# Patient Record
Sex: Male | Born: 1945
Health system: Southern US, Community
[De-identification: ages and names within clinical notes are randomized; demographics above are authoritative.]

## PROBLEM LIST (undated history)

## (undated) DIAGNOSIS — F419 Anxiety disorder, unspecified: Secondary | ICD-10-CM

## (undated) DIAGNOSIS — G8929 Other chronic pain: Secondary | ICD-10-CM

## (undated) DIAGNOSIS — J439 Emphysema, unspecified: Secondary | ICD-10-CM

## (undated) DIAGNOSIS — Z9289 Personal history of other medical treatment: Secondary | ICD-10-CM

## (undated) DIAGNOSIS — J189 Pneumonia, unspecified organism: Secondary | ICD-10-CM

## (undated) DIAGNOSIS — F329 Major depressive disorder, single episode, unspecified: Secondary | ICD-10-CM

## (undated) DIAGNOSIS — I4891 Unspecified atrial fibrillation: Principal | ICD-10-CM

## (undated) DIAGNOSIS — M1990 Unspecified osteoarthritis, unspecified site: Secondary | ICD-10-CM

## (undated) DIAGNOSIS — M549 Dorsalgia, unspecified: Secondary | ICD-10-CM

## (undated) HISTORY — DX: Personal history of other medical treatment: Z92.89

## (undated) HISTORY — DX: Anxiety disorder, unspecified: F41.9

## (undated) HISTORY — DX: Emphysema, unspecified: J43.9

---

## 1969-08-22 DIAGNOSIS — F32A Depression, unspecified: Secondary | ICD-10-CM

## 1969-08-22 DIAGNOSIS — J189 Pneumonia, unspecified organism: Secondary | ICD-10-CM

## 1969-08-22 HISTORY — DX: Pneumonia, unspecified organism: J18.9

## 1969-08-22 HISTORY — DX: Depression, unspecified: F32.A

## 2005-12-22 HISTORY — PX: INGUINAL HERNIA REPAIR: SUR1180

## 2006-08-25 ENCOUNTER — Encounter: Admission: RE | Admit: 2006-08-25 | Discharge: 2006-08-25 | Payer: Self-pay | Admitting: General Surgery

## 2006-09-02 ENCOUNTER — Ambulatory Visit (HOSPITAL_BASED_OUTPATIENT_CLINIC_OR_DEPARTMENT_OTHER): Admission: RE | Admit: 2006-09-02 | Discharge: 2006-09-02 | Payer: Self-pay | Admitting: General Surgery

## 2014-02-03 ENCOUNTER — Encounter (HOSPITAL_COMMUNITY): Payer: Self-pay | Admitting: Emergency Medicine

## 2014-02-03 ENCOUNTER — Inpatient Hospital Stay (HOSPITAL_COMMUNITY)
Admission: EM | Admit: 2014-02-03 | Discharge: 2014-02-06 | DRG: 309 | Disposition: A | Discharge status: Home / Self Care | Payer: Medicare Other | Attending: Internal Medicine | Admitting: Internal Medicine

## 2014-02-03 ENCOUNTER — Inpatient Hospital Stay (HOSPITAL_COMMUNITY): Payer: Medicare Other

## 2014-02-03 ENCOUNTER — Emergency Department (HOSPITAL_COMMUNITY): Payer: Medicare Other

## 2014-02-03 DIAGNOSIS — E872 Acidosis, unspecified: Secondary | ICD-10-CM | POA: Diagnosis present

## 2014-02-03 DIAGNOSIS — R0989 Other specified symptoms and signs involving the circulatory and respiratory systems: Secondary | ICD-10-CM | POA: Diagnosis present

## 2014-02-03 DIAGNOSIS — R000 Tachycardia, unspecified: Secondary | ICD-10-CM | POA: Diagnosis present

## 2014-02-03 DIAGNOSIS — R0609 Other forms of dyspnea: Secondary | ICD-10-CM | POA: Diagnosis present

## 2014-02-03 DIAGNOSIS — Z602 Problems related to living alone: Secondary | ICD-10-CM

## 2014-02-03 DIAGNOSIS — I4891 Unspecified atrial fibrillation: Principal | ICD-10-CM | POA: Diagnosis present

## 2014-02-03 DIAGNOSIS — E785 Hyperlipidemia, unspecified: Secondary | ICD-10-CM | POA: Diagnosis present

## 2014-02-03 DIAGNOSIS — J9819 Other pulmonary collapse: Secondary | ICD-10-CM | POA: Diagnosis present

## 2014-02-03 DIAGNOSIS — I10 Essential (primary) hypertension: Secondary | ICD-10-CM | POA: Diagnosis present

## 2014-02-03 DIAGNOSIS — M549 Dorsalgia, unspecified: Secondary | ICD-10-CM | POA: Diagnosis present

## 2014-02-03 DIAGNOSIS — R0600 Dyspnea, unspecified: Secondary | ICD-10-CM

## 2014-02-03 DIAGNOSIS — G8929 Other chronic pain: Secondary | ICD-10-CM | POA: Diagnosis present

## 2014-02-03 DIAGNOSIS — F17200 Nicotine dependence, unspecified, uncomplicated: Secondary | ICD-10-CM | POA: Diagnosis present

## 2014-02-03 HISTORY — DX: Unspecified osteoarthritis, unspecified site: M19.90

## 2014-02-03 HISTORY — DX: Major depressive disorder, single episode, unspecified: F32.9

## 2014-02-03 HISTORY — DX: Other chronic pain: G89.29

## 2014-02-03 HISTORY — DX: Dorsalgia, unspecified: M54.9

## 2014-02-03 HISTORY — DX: Pneumonia, unspecified organism: J18.9

## 2014-02-03 HISTORY — DX: Unspecified atrial fibrillation: I48.91

## 2014-02-03 LAB — BASIC METABOLIC PANEL
BUN: 17 mg/dL (ref 6–23)
CO2: 22 mEq/L (ref 19–32)
CREATININE: 0.93 mg/dL (ref 0.50–1.35)
Calcium: 9.1 mg/dL (ref 8.4–10.5)
Chloride: 99 mEq/L (ref 96–112)
GFR, EST NON AFRICAN AMERICAN: 85 mL/min — AB (ref >90)
Glucose, Bld: 116 mg/dL — ABNORMAL HIGH (ref 70–99)
POTASSIUM: 4.3 meq/L (ref 3.7–5.3)
Sodium: 134 mEq/L — ABNORMAL LOW (ref 137–147)

## 2014-02-03 LAB — CBC
HCT: 43.7 % (ref 39.0–52.0)
HEMOGLOBIN: 15.4 g/dL (ref 13.0–17.0)
MCH: 32.5 pg (ref 26.0–34.0)
MCHC: 35.2 g/dL (ref 30.0–36.0)
MCV: 92.2 fL (ref 78.0–100.0)
Platelets: 155 10*3/uL (ref 150–400)
RBC: 4.74 MIL/uL (ref 4.22–5.81)
RDW: 13 % (ref 11.5–15.5)
WBC: 12.6 10*3/uL — AB (ref 4.0–10.5)

## 2014-02-03 LAB — LIPID PANEL
CHOLESTEROL: 166 mg/dL (ref 0–200)
HDL: 50 mg/dL (ref >39)
LDL Cholesterol: 101 mg/dL — ABNORMAL HIGH (ref 0–99)
Total CHOL/HDL Ratio: 3.3 RATIO
Triglycerides: 74 mg/dL (ref <150)
VLDL: 15 mg/dL (ref 0–40)

## 2014-02-03 LAB — TROPONIN I
Troponin I: <0.3 ng/mL (ref <0.30)
Troponin I: <0.3 ng/mL (ref <0.30)

## 2014-02-03 LAB — PRO B NATRIURETIC PEPTIDE: Pro B Natriuretic peptide (BNP): 1042 pg/mL — ABNORMAL HIGH (ref 0–125)

## 2014-02-03 LAB — POCT I-STAT TROPONIN I: TROPONIN I, POC: 0.03 ng/mL (ref 0.00–0.08)

## 2014-02-03 LAB — MRSA PCR SCREENING: MRSA by PCR: NEGATIVE

## 2014-02-03 MED ORDER — SODIUM CHLORIDE 0.9 % IJ SOLN
3.0000 mL | Freq: Two times a day (BID) | INTRAMUSCULAR | Status: DC
  Administered 2014-02-04 – 2014-02-06 (×3): 3 mL via INTRAVENOUS

## 2014-02-03 MED ORDER — DILTIAZEM HCL 100 MG IV SOLR
5.0000 mg/h | INTRAVENOUS | Status: DC
  Administered 2014-02-04: 5 mg/h via INTRAVENOUS
  Filled 2014-02-03: qty 100

## 2014-02-03 MED ORDER — ASPIRIN 81 MG PO CHEW
324.0000 mg | CHEWABLE_TABLET | Freq: Once | ORAL | Status: AC
  Administered 2014-02-03: 324 mg via ORAL
  Filled 2014-02-03: qty 4

## 2014-02-03 MED ORDER — ENOXAPARIN SODIUM 100 MG/ML ~~LOC~~ SOLN
90.0000 mg | Freq: Two times a day (BID) | SUBCUTANEOUS | Status: DC
  Administered 2014-02-03 – 2014-02-05 (×4): 90 mg via SUBCUTANEOUS
  Filled 2014-02-03 (×8): qty 1

## 2014-02-03 MED ORDER — IPRATROPIUM-ALBUTEROL 0.5-2.5 (3) MG/3ML IN SOLN: 3.0000 mL | RESPIRATORY_TRACT | Status: DC | PRN

## 2014-02-03 MED ORDER — DILTIAZEM LOAD VIA INFUSION
20.0000 mg | Freq: Once | INTRAVENOUS | Status: AC
  Filled 2014-02-03: qty 20

## 2014-02-03 MED ORDER — DILTIAZEM HCL 25 MG/5ML IV SOLN
15.0000 mg | Freq: Once | INTRAVENOUS | Status: AC
  Administered 2014-02-03: 15 mg via INTRAVENOUS
  Filled 2014-02-03: qty 5

## 2014-02-03 MED ORDER — DILTIAZEM HCL 100 MG IV SOLR
5.0000 mg/h | INTRAVENOUS | Status: DC
  Administered 2014-02-03: 5 mg/h via INTRAVENOUS
  Filled 2014-02-03: qty 100

## 2014-02-03 NOTE — H&P (Signed)
Date: 02/03/2014               Patient Name:  Kenneth Murphy MRN: [id number]  DOB: 08/12/1946 Age / Sex: 68 y.o., male   PCP: No Pcp Per Patient         Medical Service: Internal Medicine Teaching Service         Attending Physician: Dr. Aletta EdouardShilpa Bhardwaj, MD    First Contact: Dr. Evelena PeatAlex Janya Eveland Pager: [phone]  Second Contact: Dr. Charlsie MerlesKathryn Glenn Pager: [phone]       After Hours (After 5p/  First Contact Pager: [phone]  weekends / holidays): Second Contact Pager: [phone]   Chief Complaint: dyspnea on exertion  History of Present Illness: Kenneth Murphy is a 68 year old male with no medical follow-up who presents with 5 week complaint of dyspnea of exertion.  He says he has had DOE for the past 5 weeks and initially his symptoms were associated with productive cough and wheezing.  He went to urgent care, was diagnosed with bronchitis and completed a course of antibiotic and prednisone.  His cough and wheezing improved but his dyspnea has persisted.  Today he went back to urgent care and was found to be in Afib with a rate of 160.  He was sent to ED. The dyspnea is brought on with exertion, relieved by rest.  In the past few week he has sometimes had to sleep sitting up in a chair.  He denies PND, lower extremity edema, chest pain, hemoptysis or palpitations.  He takes no medications.  He has smoked since age 68 (55 years) and reports smoking about 1 pack per day.  He says he quit smoking about 1 week ago.  He says the physician at urgent care said he had emphysema.  He denies past medical history, however before his recent trips to urgent care he had not seen a physician in years.  In the ED:  T 98.68F, RR 18, SpO2 92% on RA, HR 118, BP 146/78; he was started on a Cardizem drip and received ASA.  Meds: Current Facility-Administered Medications  Medication Dose Route Frequency Provider Last Rate Last Dose  . diltiazem (CARDIZEM) 100 mg in dextrose 5 % 100 mL infusion  5-15 mg/hr Intravenous Titrated  Enid SkeensJoshua M Zavitz, MD 5 mL/hr at 02/03/14 1511 5 mg/hr at 02/03/14 1511   Current Outpatient Prescriptions  Medication Sig Dispense Refill  . albuterol (PROVENTIL HFA;VENTOLIN HFA) 108 (90 BASE) MCG/ACT inhaler Inhale 2 puffs into the lungs every 6 (six) hours as needed for wheezing or shortness of breath.      . Multiple Vitamins-Minerals (MULTIVITAMIN WITH MINERALS) tablet Take 1 tablet by mouth daily.      . vitamin E 100 UNIT capsule Take 100 Units by mouth daily.        Allergies: Allergies as of 02/03/2014  . (No Known Allergies)   History reviewed. No pertinent past medical history. Past Surgical History  Procedure Laterality Date  . Hernia repair     History reviewed. No pertinent family history. History   Social History  . Marital Status: Single    Spouse Name: N/A    Number of Children: N/A  . Years of Education: N/A   Occupational History  . Not on file.   Social History Main Topics  . Smoking status: Former Smoker    Types: Cigarettes    Quit date: 01/31/2014  . Smokeless tobacco: Never Used  . Alcohol Use: Yes     Comment: occasiomally  .  Drug Use: Not on file  . Sexual Activity: No   Other Topics Concern  . Not on file   Social History Narrative  . No narrative on file    Review of Systems: Pertinent items noted in HPI. General:  + fatigue when dyspneic, no weakness Pulmonary:  + DOE, denies cough or hemoptysis Cardiac:  Denies CP or palpitations GI:  Good appetite, denies N/V, diarrhea or abdominal pain GU:  No problems with urination  Physical Exam: Blood pressure 143/105, pulse 119, temperature 98.7 F (37.1 C), temperature source Oral, resp. rate 28, height 5\' 10"  (1.778 m), weight 87.544 kg (193 lb), SpO2 99.00%. General: resting in bed in NAD  HEENT: PERRL, EOMI, no scleral icterus Cardiac: RRR, no rubs, murmurs or gallops Pulm: decreased BS globally, moving normal volumes of air Abd: soft, nontender, nondistended, BS present Ext: warm  and well perfused, distal pulses intact, no pedal edema Neuro: alert and oriented X3, cranial nerves II-XII grossly intact, 5/5 strength B/L upper and lower extremities  Lab results: Basic Metabolic Panel:  Recent Labs  16/10/96 1309  NA 134*  K 4.3  CL 99  CO2 22  GLUCOSE 116*  BUN 17  CREATININE 0.93  CALCIUM 9.1   CBC:  Recent Labs  02/03/14 1309  WBC 12.6*  HGB 15.4  HCT 43.7  MCV 92.2  PLT 155   Cardiac Enzymes:  Recent Labs  02/03/14 1309  TROPONINI <0.30   BNP:  Recent Labs  02/03/14 1309  PROBNP 1042.0*   Imaging results:  Dg Chest Port 1 View  02/03/2014   CLINICAL DATA:  Shortness of breath.  Atrial fibrillation.  EXAM: PORTABLE CHEST - 1 VIEW  COMPARISON:  01/08/2014 and 08/25/2006  FINDINGS: Heart size and pulmonary vascularity are normal. There is focal slight atelectasis in the right midzone. Lungs are otherwise clear. No acute osseous abnormality.  IMPRESSION: Focal linear atelectasis in the right midzone.   Electronically Signed   By: Geanie Cooley M.D.   On: 02/03/2014 13:29    Other results: EKG:  HR 121 bpm, atrial fibrillation, borderline prolonged QT  Assessment & Plan by Problem: 68 year old male with no medical follow-up who presents with 5 week complaint of dyspnea of exertion.   Afib with RVR:  HR 160 at urgent care.  Cardizem drip started in ED.  Difficult to calculate a CHADS2 score without known medical history. - admit to SDU on telemetry - continue Cardizem dtip - Lovenox per pharmacy for anticoagulation - CE x 3  Dyspnea:  Given his smoking history COPD is in the differential.  He does not look volume overloaded on exam but proBNP is 1000.  Unsure of his baseline so heart failure is in the differential.  Atelectasis vs. infiltrate on CXR, no signs of infection.  Wells score is Wells score is 1.5 putting making PE unlikely.  Afib likely contributing.   - oxygen supplementation - Duonebs prn - 2D ECHO  - chest 2 view - will  give Lasix if evidence of pulmonary edema  Diet:  Heart healthy  DVT ppx:  Lovenox and SCDs  Dispo: Disposition is deferred at this time, awaiting improvement of current medical problems. Anticipated discharge in approximately 1-2 day(s).   The patient does not have a current PCP (No Pcp Per Patient) and does need an [facility] hospital follow-up appointment after discharge.  The patient does not know have transportation limitations that hinder transportation to clinic appointments.  Signed: Evelena Peat, DO 02/03/2014,  3:47 PM

## 2014-02-03 NOTE — ED Notes (Signed)
Pharmacy called for cardizem gtt.

## 2014-02-03 NOTE — ED Provider Notes (Signed)
CSN: [id number]     Arrival date & time 02/03/14  1220 History   First MD Initiated Contact with Patient 02/03/14 1336     Chief Complaint  Patient presents with  . Atrial Fibrillation     (Consider location/radiation/quality/duration/timing/severity/associated sxs/prior Treatment) HPI Comments: 68 yo male with bronchitis, smoking presents with sob gradually worsening for 3-4 wks.  Pt seen at outpt center 4 wks prior, treated as bronchitis, no improvement, outpt found to have new onset a fib.  No known heart issues, no stent hx.  Exertional.  Improves with rest.  No leg swelling, no orthopnea.   Patient is a 68 y.o. male presenting with atrial fibrillation. The history is provided by the patient.  Atrial Fibrillation This is a recurrent problem. Associated symptoms include shortness of breath. Pertinent negatives include no chest pain, no abdominal pain and no headaches.    History reviewed. No pertinent past medical history. Past Surgical History  Procedure Laterality Date  . Hernia repair     History reviewed. No pertinent family history. History  Substance Use Topics  . Smoking status: Former Smoker    Types: Cigarettes    Quit date: 01/31/2014  . Smokeless tobacco: Never Used  . Alcohol Use: Yes     Comment: occasiomally    Review of Systems  Constitutional: Positive for fatigue. Negative for fever and chills.  HENT: Negative for congestion.   Eyes: Negative for visual disturbance.  Respiratory: Positive for shortness of breath.   Cardiovascular: Positive for palpitations. Negative for chest pain and leg swelling.  Gastrointestinal: Negative for vomiting and abdominal pain.  Genitourinary: Negative for dysuria and flank pain.  Musculoskeletal: Negative for back pain, neck pain and neck stiffness.  Skin: Negative for rash.  Neurological: Negative for light-headedness and headaches.      Allergies  Review of patient's allergies indicates no known allergies.  Home  Medications   Current Outpatient Rx  Name  Route  Sig  Dispense  Refill  . albuterol (PROVENTIL HFA;VENTOLIN HFA) 108 (90 BASE) MCG/ACT inhaler   Inhalation   Inhale 2 puffs into the lungs every 6 (six) hours as needed for wheezing or shortness of breath.         . Multiple Vitamins-Minerals (MULTIVITAMIN WITH MINERALS) tablet   Oral   Take 1 tablet by mouth daily.         . vitamin E 100 UNIT capsule   Oral   Take 100 Units by mouth daily.          BP 143/105  Pulse 119  Temp(Src) 98.7 F (37.1 C) (Oral)  Resp 28  Ht 5\' 10"  (1.778 m)  Wt 193 lb (87.544 kg)  BMI 27.69 kg/m2  SpO2 99% Physical Exam  Nursing note and vitals reviewed. Constitutional: He is oriented to person, place, and time. He appears well-developed and well-nourished.  HENT:  Head: Normocephalic and atraumatic.  Eyes: Conjunctivae are normal. Right eye exhibits no discharge. Left eye exhibits no discharge.  Neck: Normal range of motion. Neck supple. No tracheal deviation present.  Cardiovascular: An irregularly irregular rhythm present. Tachycardia present.   Pulmonary/Chest: Effort normal. He has rales (fine crackles at bases).  Abdominal: Soft. He exhibits no distension. There is no tenderness. There is no guarding.  Musculoskeletal: He exhibits no edema.  Neurological: He is alert and oriented to person, place, and time.  Skin: Skin is warm. No rash noted.  Psychiatric: He has a normal mood and affect.    ED Course  Procedures (including critical care time) Labs Review Labs Reviewed  BASIC METABOLIC PANEL - Abnormal; Notable for the following:    Sodium 134 (*)    Glucose, Bld 116 (*)    GFR calc non Af Amer 85 (*)    All other components within normal limits  CBC - Abnormal; Notable for the following:    WBC 12.6 (*)    All other components within normal limits  PRO B NATRIURETIC PEPTIDE - Abnormal; Notable for the following:    Pro B Natriuretic peptide (BNP) 1042.0 (*)    All other  components within normal limits  TROPONIN I  POCT I-STAT TROPONIN I   Imaging Review Dg Chest Port 1 View  02/03/2014   CLINICAL DATA:  Shortness of breath.  Atrial fibrillation.  EXAM: PORTABLE CHEST - 1 VIEW  COMPARISON:  01/08/2014 and 08/25/2006  FINDINGS: Heart size and pulmonary vascularity are normal. There is focal slight atelectasis in the right midzone. Lungs are otherwise clear. No acute osseous abnormality.  IMPRESSION: Focal linear atelectasis in the right midzone.   Electronically Signed   By: Geanie CooleyJim  Maxwell M.D.   On: 02/03/2014 13:29    EKG Interpretation    Date/Time:  Friday February 03 2014 12:30:25 EST Ventricular Rate:  121 PR Interval:    QRS Duration: 86 QT Interval:  336 QTC Calculation: 477 R Axis:   21 Text Interpretation:  Atrial fibrillation Borderline prolonged QT interval Confirmed by Monserrath Junio  MD, Alando Colleran ([id number]) on 02/03/2014 1:37:27 PM            MDM   Final diagnoses:  Atrial fibrillation with RVR  Hypertension  Smoker  Acute dyspnea    New onset a fib.   ASA given.   Cardizem bolus and drip titrated in ED.  Concern for CHF with crackles at bases. CXR review, no acute findings.  The patients results and plan were reviewed and discussed.   Any x-rays performed were personally reviewed by myself.   Differential diagnosis were considered with the presenting HPI.   EKG:  Reviewed, a fib  Admission/ observation were discussed with the admitting physician, patient and/or family and they are comfortable with the plan.     Enid SkeensJoshua M Erubiel Manasco, MD 02/03/14 [phone]

## 2014-02-03 NOTE — ED Notes (Signed)
[facility] for SOB, and new onset Afib. C/o cp, placed on oxygen and pain went away. Has been having SOB for 5 weeks now. Initial rate 160's given 2-10 mg boluses of cardizem, no gtt started. 18g to LAC.

## 2014-02-03 NOTE — Progress Notes (Signed)
ANTICOAGULATION CONSULT NOTE - Initial Consult  Pharmacy Consult for Lovenox Indication: atrial fibrillation  No Known Allergies  Patient Measurements: Height: 5\' 10"  (177.8 cm) Weight: 193 lb (87.544 kg) IBW/kg (Calculated) : 73  Vital Signs: Temp: 98.2 F (36.8 C) (02/13 1623) Temp src: Oral (02/13 1623) BP: 134/92 mmHg (02/13 1630) Pulse Rate: 95 (02/13 1630)  Labs:  Recent Labs  02/03/14 1309  HGB 15.4  HCT 43.7  PLT 155  CREATININE 0.93  TROPONINI <0.30    Estimated Creatinine Clearance: 79.6 ml/min (by C-G formula based on Cr of 0.93).  Assessment:  New onset atrial fibrillation.  Cardizem drip begun in ED.  Bed ready, to begin Lovenox on the floor.  Noted plan to begin Coumadin on 2/14.  Goal of Therapy:  Anti-Xa level 0.6-1 units/ml 4hrs after LMWH dose given Monitor platelets by anticoagulation protocol: Yes   Plan:   Lovenox 90 mg SQ q12hrs.  CBC q72 hrs while on Lovenox.  Will follow up for Coumadin initiation on 2/14.  Dennie Fettersgan, [place] Acklin Donovan, [place] Pager: [phone] 02/03/2014,4:39 PM

## 2014-02-03 NOTE — ED Notes (Signed)
Admitting MD at bedside.

## 2014-02-03 NOTE — ED Notes (Signed)
Dr. Zavitz at the bedside.  

## 2014-02-04 DIAGNOSIS — I4891 Unspecified atrial fibrillation: Secondary | ICD-10-CM

## 2014-02-04 DIAGNOSIS — R0609 Other forms of dyspnea: Secondary | ICD-10-CM

## 2014-02-04 DIAGNOSIS — R0989 Other specified symptoms and signs involving the circulatory and respiratory systems: Secondary | ICD-10-CM

## 2014-02-04 LAB — CBC WITH DIFFERENTIAL/PLATELET
Basophils Absolute: 0 10*3/uL (ref 0.0–0.1)
Basophils Relative: 0 % (ref 0–1)
EOS ABS: 0 10*3/uL (ref 0.0–0.7)
Eosinophils Relative: 0 % (ref 0–5)
HCT: 43.6 % (ref 39.0–52.0)
HEMOGLOBIN: 15.1 g/dL (ref 13.0–17.0)
LYMPHS ABS: 2 10*3/uL (ref 0.7–4.0)
LYMPHS PCT: 19 % (ref 12–46)
MCH: 32.2 pg (ref 26.0–34.0)
MCHC: 34.6 g/dL (ref 30.0–36.0)
MCV: 93 fL (ref 78.0–100.0)
MONOS PCT: 8 % (ref 3–12)
Monocytes Absolute: 0.8 10*3/uL (ref 0.1–1.0)
NEUTROS PCT: 73 % (ref 43–77)
Neutro Abs: 7.6 10*3/uL (ref 1.7–7.7)
Platelets: 122 10*3/uL — ABNORMAL LOW (ref 150–400)
RBC: 4.69 MIL/uL (ref 4.22–5.81)
RDW: 13.3 % (ref 11.5–15.5)
WBC: 10.5 10*3/uL (ref 4.0–10.5)

## 2014-02-04 LAB — HEMOGLOBIN A1C
Hgb A1c MFr Bld: 5.8 % — ABNORMAL HIGH (ref <5.7)
Mean Plasma Glucose: 120 mg/dL — ABNORMAL HIGH (ref <117)

## 2014-02-04 LAB — TROPONIN I: Troponin I: <0.3 ng/mL (ref <0.30)

## 2014-02-04 MED ORDER — DILTIAZEM HCL 90 MG PO TABS
90.0000 mg | ORAL_TABLET | Freq: Two times a day (BID) | ORAL | Status: DC
  Administered 2014-02-04 – 2014-02-05 (×3): 90 mg via ORAL
  Filled 2014-02-04 (×4): qty 1

## 2014-02-04 MED ORDER — IPRATROPIUM-ALBUTEROL 0.5-2.5 (3) MG/3ML IN SOLN
3.0000 mL | Freq: Four times a day (QID) | RESPIRATORY_TRACT | Status: DC
  Administered 2014-02-05 – 2014-02-06 (×4): 3 mL via RESPIRATORY_TRACT
  Filled 2014-02-04 (×5): qty 3

## 2014-02-04 MED ORDER — IPRATROPIUM-ALBUTEROL 0.5-2.5 (3) MG/3ML IN SOLN
3.0000 mL | RESPIRATORY_TRACT | Status: DC
  Administered 2014-02-04 (×2): 3 mL via RESPIRATORY_TRACT
  Filled 2014-02-04 (×2): qty 3

## 2014-02-04 MED ORDER — DILTIAZEM HCL 90 MG PO TABS
90.0000 mg | ORAL_TABLET | Freq: Two times a day (BID) | ORAL | Status: DC
  Filled 2014-02-04 (×2): qty 1

## 2014-02-04 NOTE — Progress Notes (Signed)
Pt converted from A-fib to NSR with occasional PAC's, Dr. Mariea ClontsEmokpae notified of pt change in cardiac rhythm. Will continue to monitor pt.

## 2014-02-04 NOTE — Progress Notes (Signed)
Subjective: [name] was seen and examined this morning.  His breathing is better and he is not using the [place].  He seems very concerned with getting his remote to work because he likes to watch tv.    Objective: Vital signs in last 24 hours: Filed Vitals:   02/04/14 0400 02/04/14 0500 02/04/14 0600 02/04/14 0800  BP: 132/79 112/76 120/89 126/87  Pulse: 89 86 84   Temp:    98 F (36.7 C)  TempSrc:    Oral  Resp: 22 25 20 16   Height:      Weight:      SpO2: 94% 91% 94% 97%   Weight change:   Intake/Output Summary (Last 24 hours) at 02/04/14 1111 Last data filed at 02/04/14 0600  Gross per 24 hour  Intake    315 ml  Output    375 ml  Net    -60 ml   General: sitting up in chair in NAD  Cardiac: irregularly irregular, no rubs, murmurs or gallops, no JVD Pulm: clear to auscultation bilaterally, moving normal volumes of air Abd: soft, nontender, nondistended, BS present Ext: warm and well perfused, no pedal edema Neuro: alert and oriented X3  Lab Results: Basic Metabolic Panel:  Recent Labs Lab 02/03/14 1309  NA 134*  K 4.3  CL 99  CO2 22  GLUCOSE 116*  BUN 17  CREATININE 0.93  CALCIUM 9.1   CBC:  Recent Labs Lab 02/03/14 1309  WBC 12.6*  HGB 15.4  HCT 43.7  MCV 92.2  PLT 155   Cardiac Enzymes:  Recent Labs Lab 02/03/14 1309 02/03/14 2022 02/04/14 0130  TROPONINI <0.30 <0.30 <0.30   BNP:  Recent Labs Lab 02/03/14 1309  PROBNP 1042.0*   Hemoglobin A1C:  Recent Labs Lab 02/03/14 2022  HGBA1C 5.8*   Fasting Lipid Panel:  Recent Labs Lab 02/03/14 2022  CHOL 166  HDL 50  LDLCALC 101*  TRIG 74  CHOLHDL 3.3   Micro Results: Recent Results (from the past 240 hour(s))  MRSA PCR SCREENING     Status: None   Collection Time    02/03/14  5:28 PM      Result Value Ref Range Status   MRSA by PCR NEGATIVE  NEGATIVE Final   Comment:            The GeneXpert MRSA Assay (FDA     approved for NASAL specimens     only), is one component  of a     comprehensive MRSA colonization     surveillance program. It is not     intended to diagnose MRSA     infection nor to guide or     monitor treatment for     MRSA infections.   Studies/Results: Dg Chest 2 View  02/04/2014   CLINICAL DATA:  Short of breath  EXAM: CHEST  2 VIEW  COMPARISON:  Prior chest x-ray 02/03/2014  FINDINGS: Slightly improved inspiratory volumes with decreased bibasilar atelectasis. Stable cardiac and mediastinal contours. Persistent right mid lung and right lower lobe atelectasis versus infiltrate. No pneumothorax or pleural effusion. Unremarkable visualized upper abdominal bowel gas pattern. No acute osseous abnormality.  IMPRESSION: 1. Slightly improved inspiratory volumes with decreasing atelectasis. 2. Persistent right mid lung and right lower lobe patchy and linear airspace opacities which may reflect areas of atelectasis, or infiltrate.   Electronically Signed   By: Malachy MoanHeath  McCullough M.D.   On: 02/04/2014 02:30   [facility] [address]  02/03/2014   CLINICAL DATA:  Shortness of breath.  Atrial fibrillation.  EXAM: PORTABLE CHEST - 1 VIEW  COMPARISON:  01/08/2014 and 08/25/2006  FINDINGS: Heart size and pulmonary vascularity are normal. There is focal slight atelectasis in the right midzone. Lungs are otherwise clear. No acute osseous abnormality.  IMPRESSION: Focal linear atelectasis in the right midzone.   Electronically Signed   By: Geanie Cooley M.D.   On: 02/03/2014 13:29   Medications: I have reviewed the patient's current medications. Scheduled Meds: . diltiazem  90 mg Oral Q12H  . enoxaparin (LOVENOX) injection  90 mg Subcutaneous Q12H  . sodium chloride  3 mL Intravenous Q12H   Continuous Infusions:  none PRN Meds:.ipratropium-albuterol  Assessment/Plan: 68 year old male with no medical follow-up who presents with 5 week complaint of dyspnea of exertion.   Afib with RVR: HR 160 at urgent care. Rate better controlled with Cardizem, HR 90s-105.  Difficult to calculate a CHADS2 score without known medical history.  Troponins negative x 3.  - continue telemetry - Cardizem drip --> po dilt 90mg  BID - Lovenox per pharmacy for anticoagulation   Dyspnea: Given his smoking history COPD is in the differential. He does not look volume overloaded on exam but proBNP is 1000. Unsure of his baseline so heart failure is in the differential. Infilrate vs atelectasis on CXR.  WBC slightly elevated on admission but he is afebrile and lungs sound clear.  Afib likely contributing.   2D ECHO completed and awaiting interpretation. - oxygen supplementation  - Duonebs prn  - monitor CBC   Diet: Heart healthy   DVT ppx: Lovenox and SCDs  Dispo: Disposition is deferred at this time, awaiting improvement of current medical problems.  Anticipated discharge in approximately 1-2 day(s).   The patient does not have a current PCP (No Pcp Per Patient) and does need an [facility] hospital follow-up appointment after discharge.  The patient does not know have transportation limitations that hinder transportation to clinic appointments.  .Services Needed at time of discharge: Y = Yes, Blank = No PT:   OT:   RN:   Equipment:   Other:     LOS: 1 day   Evelena Peat, DO 02/04/2014, 11:11 AM

## 2014-02-04 NOTE — Progress Notes (Signed)
Echocardiogram 2D Echocardiogram has been performed.  Dorothey BasemanReel, Jaelin Fackler M 02/04/2014, 10:54 AM

## 2014-02-04 NOTE — H&P (Signed)
INTERNAL MEDICINE TEACHING ATTENDING NOTE  Day 1 of stay  Patient name: Kenneth Murphy  MRN: [id number] Date of birth: 07/01/1946   68 y.o. male, [place], who has not visited a physician in many decades. Comes in with Afib and RVR from urgent care.  Was treated for bronchitis with antibiotics for the past 3 weeks.  Cough and wheezing improved. But exertional shortness of breath continues. Feels palpitations from time to time. No fever, chills.  History of panic attacks since years after return from war. 55 pack years smoking history  Filed Vitals:   02/04/14 0900 02/04/14 1000 02/04/14 1100 02/04/14 1225  BP: 148/81 149/84 154/86 141/80  Pulse: 99 102 92   Temp:    98.2 F (36.8 C)  TempSrc:    Oral  Resp: 25 23 25    Height:      Weight:      SpO2: 97% 99% 99% 94%   Exam: No distress, PERRL, EOMI. No JVD. Alert and oriented tiems 3 Heart - irreg irreg, no murmur,  Lungs - clear to auscultation Abdomen - soft, non-tender Extremities - pulses intact, no pedal edema.    Recent Labs Lab 02/03/14 1309  NA 134*  K 4.3  CL 99  CO2 22  GLUCOSE 116*  BUN 17  CREATININE 0.93  CALCIUM 9.1    Recent Labs Lab 02/03/14 1309  HGB 15.4  HCT 43.7  WBC 12.6*  PLT 155     Recent Labs Lab 02/03/14 1309 02/03/14 2022 02/04/14 0130  TROPONINI <0.30 <0.30 <0.30    Recent Labs Lab 02/03/14 1309  PROBNP 1042.0*   Lipid Panel     Component Value Date/Time   CHOL 166 02/03/2014 2022   TRIG 74 02/03/2014 2022   HDL 50 02/03/2014 2022   CHOLHDL 3.3 02/03/2014 2022   VLDL 15 02/03/2014 2022   LDLCALC 101* 02/03/2014 2022    EKG RVR, not significant for acute MI.  . diltiazem  90 mg Oral Q12H  . enoxaparin (LOVENOX) injection  90 mg Subcutaneous Q12H  . ipratropium-albuterol  3 mL Nebulization Q4H  . sodium chloride  3 mL Intravenous Q12H    Assessment and Plan:   Shortness of breath - possible differentials could be pneumonia, CAD, COPD, CHF, PE.   For  the Afib, rate control is the goal currently, and I agree with Cardizem 90 BID. However, we need to know the LVEF of this gentleman as we beta block him to assess his CO better. 2D Echo has been done, and I have asked Dr. Sherrine MaplesGlenn to see if we can get it read sooner. The patient subjectively feels better today when he is more rate controlled - saturation 99% on room air. If the Echo shows very low EF, we will consult cardiology. Also, anticoagulation would be decided once ECHO results become available, so we can calculate CHADS2-Vasc score.   COPD possibility - agree with duonebs for now, no wheezing. PFTs can be done as outpatient. We will add TSH.   CAD - possible, stress test can be done as outpatient, ACS ruled out. Lipid panel - mild dyslipidemia.   Pneumonia - unlikely, no infitrate, symptoms of bronchitis resolved.   PE - Wells score seems low.   Hypertension - mildly elevated blood pressure. We can monitor for now on cardizem alone.      I have seen and evaluated this patient and discussed it with my IM resident team.  Please see the rest of the plan  per resident note from today.   Deontrey Massi 02/04/2014, 1:40 PM.

## 2014-02-04 NOTE — Progress Notes (Signed)
Utilization Review Completed.  

## 2014-02-05 ENCOUNTER — Encounter (HOSPITAL_COMMUNITY): Payer: Self-pay | Admitting: Internal Medicine

## 2014-02-05 DIAGNOSIS — I10 Essential (primary) hypertension: Secondary | ICD-10-CM

## 2014-02-05 DIAGNOSIS — F17200 Nicotine dependence, unspecified, uncomplicated: Secondary | ICD-10-CM

## 2014-02-05 DIAGNOSIS — I4891 Unspecified atrial fibrillation: Principal | ICD-10-CM

## 2014-02-05 LAB — LACTIC ACID, PLASMA: Lactic Acid, Venous: 1.8 mmol/L (ref 0.5–2.2)

## 2014-02-05 LAB — BASIC METABOLIC PANEL
BUN: 17 mg/dL (ref 6–23)
BUN: 18 mg/dL (ref 6–23)
CHLORIDE: 97 meq/L (ref 96–112)
CO2: 11 mEq/L — ABNORMAL LOW (ref 19–32)
CO2: 19 mEq/L (ref 19–32)
Calcium: 8.4 mg/dL (ref 8.4–10.5)
Calcium: 7.9 mg/dL — ABNORMAL LOW (ref 8.4–10.5)
Chloride: 99 mEq/L (ref 96–112)
Creatinine, Ser: 0.82 mg/dL (ref 0.50–1.35)
Creatinine, Ser: 1 mg/dL (ref 0.50–1.35)
GFR, EST AFRICAN AMERICAN: 88 mL/min — AB (ref >90)
GFR, EST NON AFRICAN AMERICAN: 89 mL/min — AB (ref >90)
GFR, EST NON AFRICAN AMERICAN: 76 mL/min — AB (ref >90)
Glucose, Bld: 108 mg/dL — ABNORMAL HIGH (ref 70–99)
Glucose, Bld: 150 mg/dL — ABNORMAL HIGH (ref 70–99)
POTASSIUM: 5.1 meq/L (ref 3.7–5.3)
Potassium: 4 mEq/L (ref 3.7–5.3)
Sodium: 130 mEq/L — ABNORMAL LOW (ref 137–147)
Sodium: 130 mEq/L — ABNORMAL LOW (ref 137–147)

## 2014-02-05 LAB — COMPREHENSIVE METABOLIC PANEL
ALT: 20 U/L (ref 0–53)
AST: 20 U/L (ref 0–37)
Albumin: 2.6 g/dL — ABNORMAL LOW (ref 3.5–5.2)
Alkaline Phosphatase: 56 U/L (ref 39–117)
BUN: 17 mg/dL (ref 6–23)
CO2: 18 meq/L — AB (ref 19–32)
CREATININE: 0.89 mg/dL (ref 0.50–1.35)
Calcium: 8.7 mg/dL (ref 8.4–10.5)
Chloride: 101 mEq/L (ref 96–112)
GFR calc Af Amer: >90 mL/min (ref >90)
GFR, EST NON AFRICAN AMERICAN: 87 mL/min — AB (ref >90)
Glucose, Bld: 114 mg/dL — ABNORMAL HIGH (ref 70–99)
Potassium: 4 mEq/L (ref 3.7–5.3)
Sodium: 134 mEq/L — ABNORMAL LOW (ref 137–147)
Total Bilirubin: 0.8 mg/dL (ref 0.3–1.2)
Total Protein: 6.2 g/dL (ref 6.0–8.3)

## 2014-02-05 LAB — CBC WITH DIFFERENTIAL/PLATELET
Basophils Absolute: 0 10*3/uL (ref 0.0–0.1)
Basophils Relative: 0 % (ref 0–1)
EOS ABS: 0.1 10*3/uL (ref 0.0–0.7)
EOS PCT: 2 % (ref 0–5)
HEMATOCRIT: 39.5 % (ref 39.0–52.0)
Hemoglobin: 13.8 g/dL (ref 13.0–17.0)
LYMPHS ABS: 2.7 10*3/uL (ref 0.7–4.0)
LYMPHS PCT: 29 % (ref 12–46)
MCH: 32.4 pg (ref 26.0–34.0)
MCHC: 34.9 g/dL (ref 30.0–36.0)
MCV: 92.7 fL (ref 78.0–100.0)
MONO ABS: 0.9 10*3/uL (ref 0.1–1.0)
Monocytes Relative: 10 % (ref 3–12)
Neutro Abs: 5.6 10*3/uL (ref 1.7–7.7)
Neutrophils Relative %: 60 % (ref 43–77)
PLATELETS: 122 10*3/uL — AB (ref 150–400)
RBC: 4.26 MIL/uL (ref 4.22–5.81)
RDW: 13.5 % (ref 11.5–15.5)
WBC: 9.4 10*3/uL (ref 4.0–10.5)

## 2014-02-05 LAB — TROPONIN I

## 2014-02-05 LAB — TSH: TSH: 0.953 u[IU]/mL (ref 0.350–4.500)

## 2014-02-05 MED ORDER — THIAMINE HCL 100 MG/ML IJ SOLN
100.0000 mg | Freq: Every day | INTRAMUSCULAR | Status: DC
  Filled 2014-02-05 (×2): qty 1

## 2014-02-05 MED ORDER — LORAZEPAM 1 MG PO TABS: 1.0000 mg | ORAL_TABLET | Freq: Four times a day (QID) | ORAL | Status: DC | PRN

## 2014-02-05 MED ORDER — SODIUM CHLORIDE 0.9 % IV BOLUS (SEPSIS)
1000.0000 mL | Freq: Once | INTRAVENOUS | Status: AC
  Administered 2014-02-05: 1000 mL via INTRAVENOUS

## 2014-02-05 MED ORDER — RIVAROXABAN 20 MG PO TABS
20.0000 mg | ORAL_TABLET | Freq: Every day | ORAL | Status: DC
  Administered 2014-02-05: 20 mg via ORAL
  Filled 2014-02-05 (×2): qty 1

## 2014-02-05 MED ORDER — ADULT MULTIVITAMIN W/MINERALS CH
1.0000 | ORAL_TABLET | Freq: Every day | ORAL | Status: DC
  Administered 2014-02-05 – 2014-02-06 (×2): 1 via ORAL
  Filled 2014-02-05 (×2): qty 1

## 2014-02-05 MED ORDER — DILTIAZEM HCL ER COATED BEADS 360 MG PO CP24
360.0000 mg | ORAL_CAPSULE | Freq: Every day | ORAL | Status: DC
  Administered 2014-02-05 – 2014-02-06 (×2): 360 mg via ORAL
  Filled 2014-02-05 (×2): qty 1

## 2014-02-05 MED ORDER — VITAMIN B-1 100 MG PO TABS
100.0000 mg | ORAL_TABLET | Freq: Every day | ORAL | Status: DC
  Administered 2014-02-05 – 2014-02-06 (×2): 100 mg via ORAL
  Filled 2014-02-05 (×2): qty 1

## 2014-02-05 MED ORDER — FOLIC ACID 1 MG PO TABS
1.0000 mg | ORAL_TABLET | Freq: Every day | ORAL | Status: DC
  Administered 2014-02-05 – 2014-02-06 (×2): 1 mg via ORAL
  Filled 2014-02-05 (×2): qty 1

## 2014-02-05 MED ORDER — PANTOPRAZOLE SODIUM 20 MG PO TBEC
20.0000 mg | DELAYED_RELEASE_TABLET | Freq: Every day | ORAL | Status: DC
  Administered 2014-02-05 – 2014-02-06 (×2): 20 mg via ORAL
  Filled 2014-02-05 (×2): qty 1

## 2014-02-05 MED ORDER — LORAZEPAM 2 MG/ML IJ SOLN: 1.0000 mg | Freq: Four times a day (QID) | INTRAMUSCULAR | Status: DC | PRN

## 2014-02-05 NOTE — Progress Notes (Signed)
Subjective: Pt was switched to po Diltiazem yesterday after he changed into NSR. His rate remains controlled, but he is still going into Afib. Pt feeling better today. He states that his SOB and DOE has improved.  Objective: Vital signs in last 24 hours: Filed Vitals:   02/05/14 0729 02/05/14 0823 02/05/14 1139 02/05/14 1209  BP: 145/85  125/73   Pulse: 83  94   Temp: 98.1 F (36.7 C)  98 F (36.7 C)   TempSrc: Oral  Oral   Resp: 20  30   Height:      Weight:      SpO2: 94% 95% 97% 99%   Weight change:   Intake/Output Summary (Last 24 hours) at 02/05/14 1443 Last data filed at 02/05/14 0300  Gross per 24 hour  Intake    240 ml  Output    425 ml  Net   -185 ml   General: sitting up in chair in NAD  Cardiac: irregularly irregular, no rubs, murmurs or gallops, no JVD Pulm: clear to auscultation bilaterally, no wheezes or rales Abd: soft, nontender, nondistended Ext: warm and well perfused, no pedal edema Neuro: alert and oriented X3, non-focal  Lab Results: Basic Metabolic Panel:  Recent Labs Lab 02/03/14 1309 02/05/14 0320  NA 134* 134*  K 4.3 4.0  CL 99 101  CO2 22 18*  GLUCOSE 116* 114*  BUN 17 17  CREATININE 0.93 0.89  CALCIUM 9.1 8.7   CBC:  Recent Labs Lab 02/04/14 1318 02/05/14 0320  WBC 10.5 9.4  NEUTROABS 7.6 5.6  HGB 15.1 13.8  HCT 43.6 39.5  MCV 93.0 92.7  PLT 122* 122*   Cardiac Enzymes:  Recent Labs Lab 02/03/14 1309 02/03/14 2022 02/04/14 0130  TROPONINI <0.30 <0.30 <0.30   BNP:  Recent Labs Lab 02/03/14 1309  PROBNP 1042.0*   Hemoglobin A1C:  Recent Labs Lab 02/03/14 2022  HGBA1C 5.8*   Fasting Lipid Panel:  Recent Labs Lab 02/03/14 2022  CHOL 166  HDL 50  LDLCALC 101*  TRIG 74  CHOLHDL 3.3   Micro Results: Recent Results (from the past 240 hour(s))  MRSA PCR SCREENING     Status: None   Collection Time    02/03/14  5:28 PM      Result Value Ref Range Status   MRSA by PCR NEGATIVE  NEGATIVE Final   Comment:            The GeneXpert MRSA Assay (FDA     approved for NASAL specimens     only), is one component of a     comprehensive MRSA colonization     surveillance program. It is not     intended to diagnose MRSA     infection nor to guide or     monitor treatment for     MRSA infections.   Studies/Results: Dg Chest 2 View  02/04/2014   CLINICAL DATA:  Short of breath  EXAM: CHEST  2 VIEW  COMPARISON:  Prior chest x-ray 02/03/2014  FINDINGS: Slightly improved inspiratory volumes with decreased bibasilar atelectasis. Stable cardiac and mediastinal contours. Persistent right mid lung and right lower lobe atelectasis versus infiltrate. No pneumothorax or pleural effusion. Unremarkable visualized upper abdominal bowel gas pattern. No acute osseous abnormality.  IMPRESSION: 1. Slightly improved inspiratory volumes with decreasing atelectasis. 2. Persistent right mid lung and right lower lobe patchy and linear airspace opacities which may reflect areas of atelectasis, or infiltrate.   Electronically Signed   By:  Malachy MoanHeath  McCullough M.D.   On: 02/04/2014 02:30   Medications: I have reviewed the patient's current medications. Scheduled Meds: . diltiazem  90 mg Oral Q12H  . enoxaparin (LOVENOX) injection  90 mg Subcutaneous Q12H  . ipratropium-albuterol  3 mL Nebulization QID  . sodium chloride  3 mL Intravenous Q12H   Continuous Infusions:  none PRN Meds:.  Assessment/Plan: 68 year old male with no medical follow-up who presents with 5 week complaint of dyspnea of exertion and was found to be in Afib with RVR.   Afib with RVR: HR 160 at urgent care prior to presenting to the ED. Started on Diltiazem drip on admission and pt converted to NSR and was transitioned to po Diltiazem. TTE with normal EF and systolic function. TSH pending. CHADS2 score is 2, and as such, the pt should be anticoagulated after discharge. He was in afib this morning with HR b/w 80-100s. Cardiology has been consulted for  this seemingly new onset afib with RVR and for anticoagulation recommendations. - Continue telemetry - Continue po Diltiazem 90mg  BID - Lovenox per pharmacy for anticoagulation  - F/u with Cardiology, appreciate recommendations  Dyspnea: Given his smoking history COPD is in the differential. He does not appear volume overloaded on exam but proBNP is 1000. Unsure of his baseline so heart failure is in the differential. Atelectasis on CXR but could not r/o infiltrate.  WBC slightly elevated on admission but have been normal over the past 2 days. He is afebrile and lungs sound clear. TTE w/o noted dysfunction, normal EF.  Afib likely contributing, and now that he is better controlled. He will need PFT in 4-6 weeks to evaluate lung function. - Duonebs prn  - O2 as needed - PT consult  Mild anion gap metabolic acidosis: New onset this morning. Bicarb has dropped to 18 from 22 yesterday and AG is 15  Repeating BMP to verify.   DVT ppx: Therapeutic Lovenox   Dispo: Will likely d/c home tomorrow   The patient does not have a current PCP (No Pcp Per Patient) and may need an [facility] hospital follow-up appointment after discharge.  The patient does not know have transportation limitations that hinder transportation to clinic appointments.  .Services Needed at time of discharge: Y = Yes, Blank = No PT:   OT:   RN:   Equipment:   Other:     LOS: 2 days   Genelle GatherKathryn F Stevan Eberwein, MD 02/05/2014, 2:43 PM

## 2014-02-05 NOTE — Consult Note (Addendum)
CARDIOLOGY CONSULT NOTE    Primary Care Physician: No PCP Per Patient Referring Physician:   Medicine service  Admit Date: 02/03/2014  Reason for consultation:  afib  Kenneth Murphy is a 68 y.o. male with a h/o arthritis and no recent medical care.  He is admitted with symptomatic afib.  He reports progressive SOB with cough for 3-4 weeks.  He was diagnosed with bronchitis and treated.  He feels that his symptoms have mostly improved.  He continues to have some exertional SOB however.  He denies chest pain, palpitations, orthopnea, PND, lower extremity edema, dizziness, presyncope, syncope, or neurologic.  He has not had bleeding.  He drinks 9 beers most days but does not feel that this is excessive.  The patient is tolerating medications without difficulties and is otherwise without complaint today.   Past Medical History  Diagnosis Date  . Atrial fibrillation     new onset 02/03/2014  . Walking pneumonia 1970's  . Exertional shortness of breath   . Arthritis     "mainly in my back; shoulders, ankles" (02/03/2014)  . Chronic back pain     "mainly small of my back; got degenerative disc there" (02/03/2014)  . Depression 1970's   Past Surgical History  Procedure Laterality Date  . Inguinal hernia repair Left 2007    . diltiazem  90 mg Oral Q12H  . enoxaparin (LOVENOX) injection  90 mg Subcutaneous Q12H  . ipratropium-albuterol  3 mL Nebulization QID  . sodium chloride  3 mL Intravenous Q12H      No Known Allergies  History   Social History  . Marital Status: Single    Spouse Name: N/A    Number of Children: N/A  . Years of Education: N/A   Occupational History  . Not on file.   Social History Main Topics  . Smoking status: Current Every Day Smoker -- 1.00 packs/day for 55 years    Types: Cigarettes    Last Attempt to Quit: 01/31/2014  . Smokeless tobacco: Never Used  . Alcohol Use: Yes     Comment: 02/03/2014 "usually drink 9-10 beers couple days/wk; haven't had any  since this past Tuesday"  . Drug Use: No  . Sexual Activity: No   Other Topics Concern  . Not on file   Social History Narrative   Retired   Lives alone    Family History  Problem Relation Age of Onset  . Atrial fibrillation      ROS- All systems are reviewed and negative except as per the HPI above  Physical Exam: Telemetry: Filed Vitals:   02/05/14 0729 02/05/14 0823 02/05/14 1139 02/05/14 1209  BP: 145/85  125/73   Pulse: 83  94   Temp: 98.1 F (36.7 C)  98 F (36.7 C)   TempSrc: Oral  Oral   Resp: 20  30   Height:      Weight:      SpO2: 94% 95% 97% 99%    GEN- The patient is well appearing, alert and oriented x 3 today.   Head- normocephalic, atraumatic Eyes-  Sclera clear, conjunctiva pink Ears- hearing intact Oropharynx- clear Neck- supple, no JVP Lymph- no cervical lymphadenopathy Lungs- Clear to ausculation bilaterally, normal work of breathing Heart- irregular rate and rhythm, no murmurs, rubs or gallops, PMI not laterally displaced GI- soft, NT, ND, + BS Extremities- no clubbing, cyanosis, or edema MS- no significant deformity or atrophy Skin- no rash or lesion Psych- euthymic mood, full affect Neuro- strength and  sensation are intact  EKG reveals afib with V rate 121 bpm, nonspecific ST/T changes Echo is reviewed   Labs:   Lab Results  Component Value Date   WBC 9.4 02/05/2014   HGB 13.8 02/05/2014   HCT 39.5 02/05/2014   MCV 92.7 02/05/2014   PLT 122* 02/05/2014    Recent Labs Lab 02/05/14 0320  NA 134*  K 4.0  CL 101  CO2 18*  BUN 17  CREATININE 0.89  CALCIUM 8.7  PROT 6.2  BILITOT 0.8  ALKPHOS 56  ALT 20  AST 20  GLUCOSE 114*   Lab Results  Component Value Date   TROPONINI <0.30 02/04/2014    Lab Results  Component Value Date   CHOL 166 02/03/2014   Lab Results  Component Value Date   HDL 50 02/03/2014   Lab Results  Component Value Date   LDLCALC 101* 02/03/2014   Lab Results  Component Value Date   TRIG 74  02/03/2014   Lab Results  Component Value Date   CHOLHDL 3.3 02/03/2014   No results found for this basename: LDLDIRECT      ASSESSMENT AND PLAN:   1.  Persistent atrial fibrillation The patient presents with symptomatic afib.  He has clinically improved with rate control.  His CHADS2VASC score is at least 2 (age and htn).  I would advise anticoagulation at this time.  Today, I discussed coumadin and novel anticoagulants including pradaxa, xarelto, and eliquis today as indicated for risk reduction in stroke and systemic emboli with nonvalvular atrial fibrillation.  Risks, benefits, and alternatives to each of these drugs were discussed at length today.  He would prefer xarelto.  I will therefore stop lovenox and start xarelto 20mg  daily.  Change diltiazem to cardizem CD 360mg  daily.   He could be discharged on this regimen.  I would favor outpatient stress testing once his rates are controlled and he has had time to recover. Consider discharge tomorrow with follow-up in our office in 7-10 days. Once adequately anticoagulated for 4 weeks we should consider cardioversion.  2. HTN New diagnosis Increase diltiazem  3. SOB Likely due to afib with RVR Consider myoview as an outpatient once afib is better controlled Per family he may have apnea Outpatient sleep study would be prudent  4. ETOH/tobacco Cessation is strongly advised  Hillis RangeJames Elohim Brune, MD 02/05/2014  3:46 PM

## 2014-02-06 DIAGNOSIS — E872 Acidosis, unspecified: Secondary | ICD-10-CM

## 2014-02-06 LAB — BASIC METABOLIC PANEL
BUN: 12 mg/dL (ref 6–23)
CHLORIDE: 98 meq/L (ref 96–112)
CO2: 19 meq/L (ref 19–32)
CREATININE: 0.8 mg/dL (ref 0.50–1.35)
Calcium: 8.2 mg/dL — ABNORMAL LOW (ref 8.4–10.5)
GFR calc non Af Amer: >90 mL/min (ref >90)
Glucose, Bld: 109 mg/dL — ABNORMAL HIGH (ref 70–99)
Potassium: 3.9 mEq/L (ref 3.7–5.3)
SODIUM: 130 meq/L — AB (ref 137–147)

## 2014-02-06 MED ORDER — DILTIAZEM HCL ER COATED BEADS 360 MG PO CP24: 360.0000 mg | ORAL_CAPSULE | Freq: Every day | ORAL | Status: DC

## 2014-02-06 MED ORDER — RIVAROXABAN 20 MG PO TABS: 20.0000 mg | ORAL_TABLET | Freq: Every day | ORAL | Status: DC

## 2014-02-06 NOTE — Progress Notes (Signed)
TELEMETRY: Reviewed telemetry pt in NSR: Filed Vitals:   02/05/14 1632 02/05/14 2000 02/06/14 0000 02/06/14 0400  BP: 141/84 152/70 138/83 134/70  Pulse: 94 96 87 86  Temp: 98.6 F (37 C) 99.1 F (37.3 C) 99.5 F (37.5 C) 98 F (36.7 C)  TempSrc: Oral Oral Oral Oral  Resp: 26 23 24 24   Height:      Weight:      SpO2: 99% 97% 96% 96%    Intake/Output Summary (Last 24 hours) at 02/06/14 0734 Last data filed at 02/06/14 0600  Gross per 24 hour  Intake   1320 ml  Output    775 ml  Net    545 ml    SUBJECTIVE Doing well today. No complaints of SOB or chest pain. Converted now to NSR.  LABS: Basic Metabolic Panel:  Recent Labs  40/98/11 2040 02/06/14 0402  NA 130* 130*  K 4.0 3.9  CL 97 98  CO2 19 19  GLUCOSE 150* 109*  BUN 18 12  CREATININE 1.00 0.80  CALCIUM 7.9* 8.2*   Liver Function Tests:  Recent Labs  02/05/14 0320  AST 20  ALT 20  ALKPHOS 56  BILITOT 0.8  PROT 6.2  ALBUMIN 2.6*   No results found for this basename: LIPASE, AMYLASE,  in the last 72 hours CBC:  Recent Labs  02/04/14 1318 02/05/14 0320  WBC 10.5 9.4  NEUTROABS 7.6 5.6  HGB 15.1 13.8  HCT 43.6 39.5  MCV 93.0 92.7  PLT 122* 122*   Cardiac Enzymes:  Recent Labs  02/03/14 2022 02/04/14 0130 02/05/14 2040  TROPONINI <0.30 <0.30 <0.30   BNP: 1042  Hemoglobin A1C:  Recent Labs  02/03/14 2022  HGBA1C 5.8*   Fasting Lipid Panel:  Recent Labs  02/03/14 2022  CHOL 166  HDL 50  LDLCALC 101*  TRIG 74  CHOLHDL 3.3   Thyroid Function Tests:  Recent Labs  02/05/14 0320  TSH 0.953    Radiology/Studies:  Dg Chest 2 View  02/04/2014   CLINICAL DATA:  Short of breath  EXAM: CHEST  2 VIEW  COMPARISON:  Prior chest x-ray 02/03/2014  FINDINGS: Slightly improved inspiratory volumes with decreased bibasilar atelectasis. Stable cardiac and mediastinal contours. Persistent right mid lung and right lower lobe atelectasis versus infiltrate. No pneumothorax or pleural  effusion. Unremarkable visualized upper abdominal bowel gas pattern. No acute osseous abnormality.  IMPRESSION: 1. Slightly improved inspiratory volumes with decreasing atelectasis. 2. Persistent right mid lung and right lower lobe patchy and linear airspace opacities which may reflect areas of atelectasis, or infiltrate.   Electronically Signed   By: Malachy Moan M.D.   On: 02/04/2014 02:30   Dg Chest Port 1 View  02/03/2014   CLINICAL DATA:  Shortness of breath.  Atrial fibrillation.  EXAM: PORTABLE CHEST - 1 VIEW  COMPARISON:  01/08/2014 and 08/25/2006  FINDINGS: Heart size and pulmonary vascularity are normal. There is focal slight atelectasis in the right midzone. Lungs are otherwise clear. No acute osseous abnormality.  IMPRESSION: Focal linear atelectasis in the right midzone.   Electronically Signed   By: Geanie Cooley M.D.   On: 02/03/2014 13:29   Echo: Study Conclusions  - Procedure narrative: Transthoracic echocardiography. Image quality was adequate. The study was technically difficult, as a result of poor sound wave transmission. - Left ventricle: The cavity size was normal. Systolic function was normal. The estimated ejection fraction was in the range of 60% to 65%. Wall motion was normal;  there were no regional wall motion abnormalities. - Right atrium: The atrium was mildly dilated. - Pulmonary arteries: PA peak pressure: 35mm Hg (S).  PHYSICAL EXAM General: Well developed, well nourished, in no acute distress. Head: Normocephalic, atraumatic, sclera non-icteric, no xanthomas, nares are without discharge. Neck: Negative for carotid bruits. JVD not elevated. Lungs: Clear bilaterally to auscultation without wheezes, rales, or rhonchi. Breathing is unlabored. Heart: RRR S1 S2 without murmurs, rubs, or gallops.  Abdomen: Soft, non-tender, non-distended with normoactive bowel sounds. No hepatomegaly. No rebound/guarding. No obvious abdominal masses. Msk:  Strength and tone  appears normal for age. Extremities: No clubbing, cyanosis or edema.  Distal pedal pulses are 2+ and equal bilaterally. Neuro: Alert and oriented X 3. Moves all extremities spontaneously. Psych:  Responds to questions appropriately with a normal affect.  ASSESSMENT AND PLAN: 1. Atrial fibrillation. Probably triggered by recent bronchitis. Now converted to NSR. CHAD-vasc score of 2. Recommend DC on Xarelto 20 mg daily and Cardizem CD. Will follow up in our office in 1-2 weeks. We will arrange follow up. 2. HTN- controlled.  3. ETOH/tobacco abuse- cessation advised.   Principal Problem:   Atrial fibrillation with RVR Active Problems:   Hypertension   Smoker    Signed, Bucky Grigg [place] MD,FACC 02/06/2014 7:39 AM

## 2014-02-06 NOTE — Progress Notes (Signed)
Internal Medicine Attending  Date: 02/06/2014  Patient name: Kenneth BlaseLarry Tirrell Medical record number: [id number] Date of birth: 02/27/1946 Age: 68 y.o. Gender: male  I saw and evaluated the patient. I reviewed the resident's note by Dr. Andrey CampanileWilson and I agree with the resident's findings and plans as documented in her progress note.  [name] feels well this AM and is looking forward to going home.  Rate well controlled on the diltiazem and he has been started on Xaralto.  Agree with D/C home today with follow-up in Cardiology for an outpatient stress test.  He will also establish with a PCP and a list was provided to him to assist in this search.

## 2014-02-06 NOTE — Evaluation (Signed)
Physical Therapy Evaluation/ Discharge Patient Details Name: Kenneth Murphy MRN: [id number] DOB: 11-23-1946 Today's Date: 02/06/2014 Time: 2563-8937 PT Time Calculation (min): 9 min  PT Assessment / Plan / Recommendation History of Present Illness  Kenneth Murphy is a 68 y.o. male with a h/o arthritis and no recent medical care.  He is admitted with symptomatic afib.  He reports progressive SOB with cough for 3-4 weeks.  He was diagnosed with bronchitis and treated.  He feels that his symptoms have mostly improved.  He continues to have some exertional SOB however  Clinical Impression  Pt reports he is at baseline functional status. Pt able to complete basic transfers, gait and mobility without deficit. With berg balance testing higher level balance deficits noted and pt recommended for OPPT to address balance but pt states that function is normal and not currently interested in OPPT. Pt with HR variable during gait from 125-173 non sustained at any rate with fluctuating wave form from NSR, AFib and a few PVC. RN made aware of activity and HR. No further acute needs at this time.     PT Assessment  All further PT needs can be met in the next venue of care    Follow Up Recommendations  Outpatient PT    Does the patient have the potential to tolerate intense rehabilitation      Barriers to Discharge        Equipment Recommendations  None recommended by PT    Recommendations for Other Services     Frequency      Precautions / Restrictions Precautions Precautions: Fall   Pertinent Vitals/Pain No pain     Mobility  Transfers Overall transfer level: Modified independent Ambulation/Gait Ambulation/Gait assistance: Independent Ambulation Distance (Feet): 150 Feet Assistive device: None Gait Pattern/deviations: WFL(Within Functional Limits) Gait velocity interpretation: at or above normal speed for age/gender Stairs: Yes Stairs assistance: Modified independent (Device/Increase  time) Stair Management: One rail Right Number of Stairs: 5    Exercises     PT Diagnosis: Difficulty walking  PT Problem List: Decreased balance PT Treatment Interventions:       PT Goals(Current goals can be found in the care plan section) Acute Rehab PT Goals PT Goal Formulation: No goals set, d/c therapy  Visit Information  Last PT Received On: 02/06/14 Assistance Needed: +1 History of Present Illness: Kenneth Murphy is a 68 y.o. male with a h/o arthritis and no recent medical care.  He is admitted with symptomatic afib.  He reports progressive SOB with cough for 3-4 weeks.  He was diagnosed with bronchitis and treated.  He feels that his symptoms have mostly improved.  He continues to have some exertional SOB however       Prior [place] expects to be discharged to:: Private residence Living Arrangements: Alone Available Help at Discharge: Family;Available PRN/intermittently Type of Home: House Home Access: Stairs to enter [employer] of Steps: 3 Home Layout: One level Home Equipment: None Prior Function Level of Independence: Independent Communication Communication: No difficulties    Cognition  Cognition Arousal/Alertness: Awake/alert Behavior During Therapy: WFL for tasks assessed/performed Overall Cognitive Status: Within Functional Limits for tasks assessed    Extremity/Trunk Assessment Upper Extremity Assessment Upper Extremity Assessment: Overall WFL for tasks assessed Lower Extremity Assessment Lower Extremity Assessment: Overall WFL for tasks assessed Cervical / Trunk Assessment Cervical / Trunk Assessment: Normal   Balance Standardized Balance Assessment Standardized Balance Assessment : Berg Balance Test Berg Balance Test Sit to Stand: Able  to stand without using hands and stabilize independently Standing Unsupported: Able to stand safely 2 minutes Sitting with Back Unsupported but Feet Supported on Floor or  Stool: Able to sit safely and securely 2 minutes Stand to Sit: Sits safely with minimal use of hands Transfers: Able to transfer safely, minor use of hands Standing Unsupported with Eyes Closed: Able to stand 10 seconds safely Standing Ubsupported with Feet Together: Able to place feet together independently and stand 1 minute safely From Standing, Reach Forward with Outstretched Arm: Can reach forward >12 cm safely (5") From Standing Position, Pick up Object from Floor: Able to pick up shoe safely and easily From Standing Position, Turn to Look Behind Over each Shoulder: Looks behind from both sides and weight shifts well Turn 360 Degrees: Able to turn 360 degrees safely in 4 seconds or less Standing Unsupported, Alternately Place Feet on Step/Stool: Able to stand independently and complete 8 steps >20 seconds Standing Unsupported, One Foot in Front: Able to take small step independently and hold 30 seconds Standing on One Leg: Tries to lift leg/unable to hold 3 seconds but remains standing independently Total Score: 49  End of Session PT - End of Session Activity Tolerance: Patient tolerated treatment well Patient left: in chair;with family/visitor present Nurse Communication: Mobility status  GP     Kenneth Murphy 02/06/2014, 12:28 PM Elwyn Reach, [place]

## 2014-02-06 NOTE — Discharge Instructions (Addendum)
Information on my medicine - XARELTO (Rivaroxaban)  This medication education was reviewed with me or my healthcare representative as part of my discharge preparation.  The pharmacist that spoke with me during my hospital stay was:  Severiano GilbertWilson, Frank Rhea, [facility]  Why was Xarelto prescribed for you? Xarelto was prescribed for you to reduce the risk of a blood clot forming that can cause a stroke if you have a medical condition called atrial fibrillation (a type of irregular heartbeat).  What do you need to know about xarelto ? Take your Xarelto ONCE DAILY at the same time every day with your evening meal. If you have difficulty swallowing the tablet whole, you may crush it and mix in applesauce just prior to taking your dose.  Take Xarelto exactly as prescribed by your doctor and DO NOT stop taking Xarelto without talking to the doctor who prescribed the medication.  Stopping without other stroke prevention medication to take the place of Xarelto may increase your risk of developing a clot that causes a stroke.  Refill your prescription before you run out.  After discharge, you should have regular check-up appointments with your healthcare provider that is prescribing your Xarelto.  In the future your dose may need to be changed if your kidney function or weight changes by a significant amount.  What do you do if you miss a dose? If you are taking Xarelto ONCE DAILY and you miss a dose, take it as soon as you remember on the same day then continue your regularly scheduled once daily regimen the next day. Do not take two doses of Xarelto at the same time or on the same day.   Important Safety Information A possible side effect of Xarelto is bleeding. You should call your healthcare provider right away if you experience any of the following:   Bleeding from an injury or your nose that does not stop.   Unusual colored urine (red or dark brown) or unusual colored stools (red or black).   Unusual bruising for unknown reasons.   A serious fall or if you hit your head (even if there is no bleeding).  Some medicines may interact with Xarelto and might increase your risk of bleeding while on Xarelto. To help avoid this, consult your healthcare provider or pharmacist prior to using any new prescription or non-prescription medications, including herbals, vitamins, non-steroidal anti-inflammatory drugs (NSAIDs) and supplements.  This website has more information on Xarelto: [email or web address].  1. Your cardiology appointment is scheduled for February 22, 2014 at 10AM.  Please be sure to keep this appointment.  2. You will need to establish care with a primary care doctor.  Please schedule an appointment in 1 week with one of the providers listed below  [facility] [address], [place], [place] [address] [phone]  [facility] [address], [place], [place] [address] [phone]  [facility]  [address], [place], [place] [address] [phone]  [facility] [address], [place], [place] [address] [phone]) [phone]  3. Please take all medications as prescribed.   4. If you have worsening of your symptoms or new symptoms arise, please call the clinic [phone]), or go to the ER immediately if symptoms are severe.

## 2014-02-06 NOTE — Care Management Note (Addendum)
    Page 1 of 1   02/06/2014     1:57:35 PM   CARE MANAGEMENT NOTE 02/06/2014  Patient:  Kenneth Murphy,[place]   Account Number:  [id number]  Date Initiated:  02/06/2014  Documentation initiated by:  Junius CreamerWELL,DEBBIE  Subjective/Objective Assessment:   adm w at fib     Action/Plan:   lives alone.   Anticipated DC Date:  02/06/2014   Anticipated DC Plan:  HOME/SELF CARE      DC Planning Services  CM consult  Medication Assistance      Choice offered to / List presented to:             Status of service:   Medicare Important Message given?   (If response is "NO", the following Medicare IM given date fields will be blank) Date Medicare IM given:   Date Additional Medicare IM given:    Discharge Disposition:  HOME/SELF CARE  Per UR Regulation:  Reviewed for med. necessity/level of care/duration of stay  If discussed at Long Length of Stay Meetings, dates discussed:    Comments:  2/16 1026a debbie Chantrell Apsey rn,bsn spoke w pt and fam. gave pt 30day free xarelto card. pt has ins plan for meds. pt will have 45.00 per month copay for xarelto. will need prior auth by phone [phone] or faxed form to [employer] [facility]. left form on shadow chart for md . left sticky note w phone number if md would rather call in for prior auth.

## 2014-02-06 NOTE — Progress Notes (Signed)
Subjective: [name] was seen and examined this morning.  He is breathing well on room air, has good appetite, no diarrhea or dysuria.  He feels ready to go home.  Objective: Vital signs in last 24 hours: Filed Vitals:   02/06/14 0719 02/06/14 0734 02/06/14 0751 02/06/14 1128  BP:  144/74  128/66  Pulse: 84 83  85  Temp:  97.4 F (36.3 C)  97.6 F (36.4 C)  TempSrc:  Oral  Oral  Resp: 21 23  21   Height:      Weight:      SpO2: 96% 98% 97% 96%   Weight change:   Intake/Output Summary (Last 24 hours) at 02/06/14 1147 Last data filed at 02/06/14 0600  Gross per 24 hour  Intake   1320 ml  Output    775 ml  Net    545 ml   General: sitting up in chair in NAD watching tv Cardiac: RRR, no rubs, murmurs or gallops Pulm: clear to auscultation bilaterally, no wheezes or rales Abd: soft, nontender, nondistended Ext: warm and well perfused, no pedal edema Neuro: alert and oriented X3  Lab Results: Basic Metabolic Panel:  Recent Labs Lab 02/05/14 2040 02/06/14 0402  NA 130* 130*  K 4.0 3.9  CL 97 98  CO2 19 19  GLUCOSE 150* 109*  BUN 18 12  CREATININE 1.00 0.80  CALCIUM 7.9* 8.2*   Studies/Results: No results found. Medications: I have reviewed the patient's current medications. Scheduled Meds: . diltiazem  360 mg Oral Daily  . folic acid  1 mg Oral Daily  . ipratropium-albuterol  3 mL Nebulization QID  . multivitamin with minerals  1 tablet Oral Daily  . pantoprazole  20 mg Oral Daily  . rivaroxaban  20 mg Oral Q supper  . sodium chloride  3 mL Intravenous Q12H  . thiamine  100 mg Oral Daily   Or  . thiamine  100 mg Intravenous Daily   Continuous Infusions:  none PRN Meds: Ativan  Assessment/Plan: 68 year old male with no medical follow-up who presents with 5 week complaint of dyspnea of exertion and was found to be in Afib with RVR.   Afib with RVR: HR 160 at urgent care prior to presenting to the ED. Started on Diltiazem drip on admission and pt  converted to NSR and was transitioned to po Diltiazem. TTE with normal EF and systolic function. TSH normal. CHADS2 score is 2, and as such, the pt should be anticoagulated after discharge. NSR this AM with HR low to mid 80s.  Cardiology consulted and recommend continuing Xarelto and Cardizem at discharge. They will arrange for cardiology follow-up in 1-2 weeks.   - stable for d/c home today - continue Xarelto 20mg  daily and Cardizem 360mg  daily - cardiology follow-up in 1-2 weeks - PT recommended outpatient PT but the patient declines  Dyspnea: Given his smoking history COPD is in the differential.  Atelectasis on CXR but could not r/o infiltrate.  WBC normal, he is afebrile and lungs sound clear. TTE w/o noted dysfunction, normal EF.  Afib likely contributing, and he is breathing well now that he is better controlled.  - He will need PFTs in 4-6 weeks to evaluate lung function.  Mild anion gap metabolic acidosis, resolved:  New onset yesterday.  Bicarb dropped as low as 11 with AG of 20.  Resolved this AM with bicarb 19, AG 13.  Tachycardia:  Periods of tachycardia noted during ambulation with PT today, HR  125-173 fluctuation between NSR, Afib and few PVCs.  Ambulated again with nursing and HR up to 130.  Initial elevation to 170s potentially due to artifact.  Patient asymptomatic during these events.  Back to 90 NSR at rest.  Dispo: He is clinically stable for discharge home.  He will need establish care with a PCP (will provide him with a list of providers in his area).  Cardiology is arranging follow in 1-2 weeks.  The patient does not have a current PCP (No Pcp Per Patient) and may need an [facility] hospital follow-up appointment after discharge.  The patient does not know have transportation limitations that hinder transportation to clinic appointments.  .Services Needed at time of discharge: Y = Yes, Blank = No PT:    OT:   RN:   Equipment:   Other:     LOS: 3 days   Evelena Peat,  DO 02/06/2014, 11:47 AM

## 2014-02-06 NOTE — Discharge Summary (Signed)
Patient Name:  Kenneth Murphy  MRN: [id number]  PCP: No PCP Per Patient  DOB:  1946/08/05       Date of Admission:  02/03/2014  Date of Discharge:  02/06/2014      Attending Physician: Dr. Doneen Poisson         DISCHARGE DIAGNOSES: 1.   Atrial fibrillation with RVR 2.   Hypertension 3.   Anion gap metabolic acidosis, mild  DISPOSITION AND FOLLOW-UP: Kenneth Murphy is to follow-up with the listed providers as detailed below, at patient's visiting, please address following issues:  1) Will need outpatient stress test once HR controlled 2) To consider cardioversion after four weeks of anticoagulation 3) Consider outpatient PFTs to evaluate for COPD given smoking history  Follow-up Information   Follow up with Tereso Newcomer, PA-C On 02/22/2014. (10:00 AM)    Specialty:  Physician Assistant   Contact information:   [address] [place] [phone]      Discharge Orders   Future Appointments Provider Department Dept Phone   02/22/2014 10:10 AM Kenneth Lecher, PA-C [facility] [place] [facility] [phone]   Future Orders Complete By Expires   Call MD for:  difficulty breathing, headache or visual disturbances  As directed    Call MD for:  extreme fatigue  As directed    Call MD for:  persistant dizziness or light-headedness  As directed    Call MD for:  persistant nausea and vomiting  As directed    Call MD for:  severe uncontrolled pain  As directed    Call MD for:  temperature >100.4  As directed    Diet - low sodium heart healthy  As directed    Increase activity slowly  As directed        DISCHARGE MEDICATIONS:   Medication List         albuterol 108 (90 BASE) MCG/ACT inhaler  Commonly known as:  PROVENTIL HFA;VENTOLIN HFA  Inhale 2 puffs into the lungs every 6 (six) hours as needed for wheezing or shortness of breath.     diltiazem 360 MG 24 hr capsule  Commonly known as:  CARDIZEM CD  Take 1 capsule (360 mg total) by mouth daily.      multivitamin with minerals tablet  Take 1 tablet by mouth daily.     Rivaroxaban 20 MG Tabs tablet  Commonly known as:  XARELTO  Take 1 tablet (20 mg total) by mouth daily with supper.     vitamin E 100 UNIT capsule  Take 100 Units by mouth daily.       CONSULTS:  Treatment Team:  Hillis Range, MD - Cardiology  PROCEDURES PERFORMED:  Dg Chest 2 View  02/04/2014   CLINICAL DATA:  Short of breath  EXAM: CHEST  2 VIEW  COMPARISON:  Prior chest x-ray 02/03/2014  FINDINGS: Slightly improved inspiratory volumes with decreased bibasilar atelectasis. Stable cardiac and mediastinal contours. Persistent right mid lung and right lower lobe atelectasis versus infiltrate. No pneumothorax or pleural effusion. Unremarkable visualized upper abdominal bowel gas pattern. No acute osseous abnormality.  IMPRESSION: 1. Slightly improved inspiratory volumes with decreasing atelectasis. 2. Persistent right mid lung and right lower lobe patchy and linear airspace opacities which may reflect areas of atelectasis, or infiltrate.   Electronically Signed   By: Malachy Moan M.D.   On: 02/04/2014 02:30   Dg Chest Port 1 View  02/03/2014   CLINICAL DATA:  Shortness of breath.  Atrial fibrillation.  EXAM: PORTABLE CHEST - 1 VIEW  COMPARISON:  01/08/2014 and 08/25/2006  FINDINGS: Heart size and pulmonary vascularity are normal. There is focal slight atelectasis in the right midzone. Lungs are otherwise clear. No acute osseous abnormality.  IMPRESSION: Focal linear atelectasis in the right midzone.   Electronically Signed   By: Geanie Cooley M.D.   On: 02/03/2014 13:29     ADMISSION DATA: H&P: Kenneth Murphy is a 68 year old male with no medical follow-up who presents with 5 week complaint of dyspnea of exertion. He says he has had DOE for the past 5 weeks and initially his symptoms were associated with productive cough and wheezing. He went to urgent care, was diagnosed with bronchitis and completed a course of  antibiotic and prednisone. His cough and wheezing improved but his dyspnea has persisted. Today he went back to urgent care and was found to be in Afib with a rate of 160. He was sent to ED. The dyspnea is brought on with exertion, relieved by rest. In the past few week he has sometimes had to sleep sitting up in a chair. He denies PND, lower extremity edema, chest pain, hemoptysis or palpitations. He takes no medications. He has smoked since age 26 (55 years) and reports smoking about 1 pack per day. He says he quit smoking about 1 week ago. He says the physician at urgent care said he had emphysema. He denies past medical history, however before his recent trips to urgent care he had not seen a physician in years.  In the ED: T 98.98F, RR 18, SpO2 92% on RA, HR 118, BP 146/78; he was started on a Cardizem drip and received ASA.  Physical Exam: Blood pressure 143/105, pulse 119, temperature 98.7 F (37.1 C), temperature source Oral, resp. rate 28, height 5\' 10"  (1.778 m), weight 87.544 kg (193 lb), SpO2 99.00%.  General: resting in bed in NAD  HEENT: PERRL, EOMI, no scleral icterus  Cardiac: RRR, no rubs, murmurs or gallops  Pulm: decreased BS globally, moving normal volumes of air  Abd: soft, nontender, nondistended, BS present  Ext: warm and well perfused, distal pulses intact, no pedal edema  Neuro: alert and oriented X3, cranial nerves II-XII grossly intact, 5/5 strength B/L upper and lower extremities  Labs: Basic Metabolic Panel:   Recent Labs   02/03/14 1309   NA  134*   K  4.3   CL  99   CO2  22   GLUCOSE  116*   BUN  17   CREATININE  0.93   CALCIUM  9.1    CBC:   Recent Labs   02/03/14 1309   WBC  12.6*   HGB  15.4   HCT  43.7   MCV  92.2   PLT  155    Cardiac Enzymes:   Recent Labs   02/03/14 1309   TROPONINI  <0.30    BNP:   Recent Labs   02/03/14 1309   PROBNP  1042.0*    HOSPITAL COURSE: Atrial fibrillation with rapid ventricular response:  He  presented with complaint of dyspnea on exertion.  Heart rates were in the 160s with evidence of atrial fibrillation on EKG.  Cardizem drip was started in the ED.  He had not been to a physician in decades prior to recent urgent care treatment for bronchitis.  ACS was ruled out and his chest Xray revealed no acute findings.  PE was unlikely given Wells score of  1.5.  He appeared euvolemic on exam but BNP was > 1000 with unknown baseline so heart failure was a possibility.  ECHO was obtained and revealed EF 60-65%.  His family reports that urgent care told him he had the "beginnings of emphysema" and given his smoking history COPD was if the differential.  His symptoms were likely caused by Afib with RVR since his dyspnea improved with correction of his arrhythmia.  Cardiology evaluated the patient during admission and given his CHADS2VASC score of 2 anticoagulation was recommended.  After review of his options the patient decided on Xarelto.  He was started on Cardizem CD 360mg  daily and Xarelto 20mg  daily during admission.  On day of discharge he was noted to have HR 125-173 fluctuating between NSR, Afib and few PVCs while ambulating with PT.  These were potentially due to artifact as his HR improved while ambulating with nurse, highest was 130.  He was asymptomatic and HR improved to 90 NSR at rest.  He was discharged in stable condition with appropriate prescriptions.  Cardiology follow-up has been scheduled for March 4.  Cardioversion can be considered after four weeks of anticoagulation.  Since he has no PCP he was provided with a list of PCPs near [facility], [place] where he resides.  He has been advised to establish care with a PCP.   Hypertension:  Diltiazem provided during admission and to be continued at discharge.  Mild anion gap metabolic acidosis:  Resolved.  This developed on hospital day 3.  He had a gap of 20 (bicar 11) which corrected to 14 (bicarb 19) on repeat BMP four hours later.  Possibly lab  error.     DISCHARGE DATA: Vital Signs: BP 128/66  Pulse 85  Temp(Src) 97.6 F (36.4 C) (Oral)  Resp 21  Ht 5\' 10"  (1.778 m)  Wt 87.544 kg (193 lb)  BMI 27.69 kg/m2  SpO2 96%  Labs: Results for orders placed during the hospital encounter of 02/03/14 (from the past 24 hour(s))  BASIC METABOLIC PANEL     Status: Abnormal   Collection Time    02/05/14  4:35 PM      Result Value Ref Range   Sodium 130 (*) 137 - 147 mEq/L   Potassium 5.1  3.7 - 5.3 mEq/L   Chloride 99  96 - 112 mEq/L   CO2 11 (*) 19 - 32 mEq/L   Glucose, Bld 108 (*) 70 - 99 mg/dL   BUN 17  6 - 23 mg/dL   Creatinine, Ser 1.61  0.50 - 1.35 mg/dL   Calcium 8.4  8.4 - 09.6 mg/dL   GFR calc non Af Amer 89 (*) >90 mL/min   GFR calc Af Amer >90  >90 mL/min  TROPONIN I     Status: None   Collection Time    02/05/14  8:40 PM      Result Value Ref Range   Troponin I <0.30  <0.30 ng/mL  LACTIC ACID, PLASMA     Status: None   Collection Time    02/05/14  8:40 PM      Result Value Ref Range   Lactic Acid, Venous 1.8  0.5 - 2.2 mmol/L  BASIC METABOLIC PANEL     Status: Abnormal   Collection Time    02/05/14  8:40 PM      Result Value Ref Range   Sodium 130 (*) 137 - 147 mEq/L   Potassium 4.0  3.7 - 5.3 mEq/L   Chloride 97  96 - 112 mEq/L   CO2 19  19 - 32 mEq/L   Glucose, Bld 150 (*) 70 - 99 mg/dL   BUN 18  6 - 23 mg/dL   Creatinine, Ser 1.611.00  0.50 - 1.35 mg/dL   Calcium 7.9 (*) 8.4 - 10.5 mg/dL   GFR calc non Af Amer 76 (*) >90 mL/min   GFR calc Af Amer 88 (*) >90 mL/min  BASIC METABOLIC PANEL     Status: Abnormal   Collection Time    02/06/14  4:02 AM      Result Value Ref Range   Sodium 130 (*) 137 - 147 mEq/L   Potassium 3.9  3.7 - 5.3 mEq/L   Chloride 98  96 - 112 mEq/L   CO2 19  19 - 32 mEq/L   Glucose, Bld 109 (*) 70 - 99 mg/dL   BUN 12  6 - 23 mg/dL   Creatinine, Ser 0.960.80  0.50 - 1.35 mg/dL   Calcium 8.2 (*) 8.4 - 10.5 mg/dL   GFR calc non Af Amer >90  >90 mL/min   GFR calc Af Amer >90  >90  mL/min     Services Ordered on Discharge: Y = Yes; Blank = No PT:  Patient declined outpatient PT  OT:   RN:   Equipment:   Other:      Time Spent on Discharge: 35 min   Signed: Evelena PeatWilson, Trenise Turay PGY 1, Internal Medicine Resident 02/06/2014, 2:45 PM

## 2014-02-20 ENCOUNTER — Encounter: Payer: Self-pay | Admitting: *Deleted

## 2014-02-22 ENCOUNTER — Encounter: Payer: Self-pay | Admitting: *Deleted

## 2014-02-22 ENCOUNTER — Ambulatory Visit (INDEPENDENT_AMBULATORY_CARE_PROVIDER_SITE_OTHER): Payer: Medicare Other | Admitting: Physician Assistant

## 2014-02-22 ENCOUNTER — Encounter: Payer: Self-pay | Admitting: Physician Assistant

## 2014-02-22 VITALS — BP 104/70 | HR 87 | Ht 72.0 in | Wt 189.8 lb

## 2014-02-22 DIAGNOSIS — I4891 Unspecified atrial fibrillation: Secondary | ICD-10-CM

## 2014-02-22 DIAGNOSIS — I10 Essential (primary) hypertension: Secondary | ICD-10-CM

## 2014-02-22 DIAGNOSIS — R0602 Shortness of breath: Secondary | ICD-10-CM

## 2014-02-22 NOTE — Patient Instructions (Addendum)
PLEASE MAKE AN APPT. WITH OUR ANITCOAGULATION CLINIC; THIS NEEDS TO BE IN 3-4 WEEKS; PT JUST STARTED ON XARELTO  Your physician has requested that you have a lexiscan myoview. For further information please visit [email or web address]. Please follow instruction sheet, as given.  Your physician has recommended that you have a sleep study. This test records several body functions during sleep, including: brain activity, eye movement, oxygen and carbon dioxide blood levels, heart rate and rhythm, breathing rate and rhythm, the flow of air through your mouth and nose, snoring, body muscle movements, and chest and belly movement.  You have been referred to DR. DONALD MOORE'S OFFICE DX SHORTNESS OF BREATH  Your physician recommends that you schedule a follow-up appointment in: 2 MONTHS WITH DR. ALLRED  YOU HAVE BEEN GIVEN A WORK NOTE TODAY

## 2014-02-22 NOTE — Progress Notes (Signed)
[address] [place], [place]  [address] Phone: [phone] Fax:  [phone]  Date:  02/22/2014   ID:  Keonta Alsip, DOB [birth year], MRN [id number]  PCP:  No PCP Per Patient  Cardiologist:  Dr. Hillis Range     History of Present Illness: Kenneth Murphy is a 68 y.o. male smoker with a hx of heavy ETOH use who was admitted last month with symptomatic AFib with RVR.  He presented with DOE and was initially dx with bronchitis.   He was seen by Dr. Hillis Range.  Echo demonstrated normal LVF.  CHADS2-VASc=2 (HTN, age > 48).  He was started on Xarelto.  He was rate controlled with Diltiazem and converted to NSR.  OP stress testing was recommended (once HR was controlled).  It was felt that OP sleep study should be considered.     Echo (02/04/14):  EF 60-65%, no RWMA, mild RAE, PASP 35.  He denies palpitations. He continues to have dyspnea on exertion. He probably describes NYHA class III symptoms. He denies significant wheezing or cough. He denies chest discomfort. He denies syncope. He sleeps in a recliner due to back pain. Denies PND or edema.  Recent Labs: 02/03/2014: HDL Cholesterol 50; LDL (calc) 101*; Pro B Natriuretic peptide (BNP) 1042.0*  02/05/2014: ALT 20; Hemoglobin 13.8; TSH 0.953  02/06/2014: Creatinine 0.80; Potassium 3.9   Wt Readings from Last 3 Encounters:  02/03/14 193 lb (87.544 kg)     Past Medical History  Diagnosis Date  . Atrial fibrillation     new onset 02/03/2014; CHADS2-VASC=2 (Xarelto started)  . Walking pneumonia 1970's  . Arthritis     "mainly in my back; shoulders, ankles" (02/03/2014)  . Chronic back pain     "mainly small of my back; got degenerative disc there" (02/03/2014)  . Depression 1970's  . Hx of echocardiogram     a. Echo (02/04/14):  EF 60-65%, no RWMA, mild RAE, PASP 35.    Current Outpatient Prescriptions  Medication Sig Dispense Refill  . albuterol (PROVENTIL HFA;VENTOLIN HFA) 108 (90 BASE) MCG/ACT inhaler Inhale 2 puffs into  the lungs every 6 (six) hours as needed for wheezing or shortness of breath.      . diltiazem (CARDIZEM CD) 360 MG 24 hr capsule Take 1 capsule (360 mg total) by mouth daily.  30 capsule  5  . Multiple Vitamins-Minerals (MULTIVITAMIN WITH MINERALS) tablet Take 1 tablet by mouth daily.      . Rivaroxaban (XARELTO) 20 MG TABS tablet Take 1 tablet (20 mg total) by mouth daily with supper.  30 tablet  5  . vitamin E 100 UNIT capsule Take 100 Units by mouth daily.       No current facility-administered medications for this visit.    Allergies:   Review of patient's allergies indicates no known allergies.   Social History:  The patient  reports that he has been smoking Cigarettes.  He has a 55 pack-year smoking history. He has never used smokeless tobacco. He reports that he drinks alcohol. He reports that he does not use illicit drugs.   Family History:  The patient's family history includes Atrial fibrillation in an other family member.   ROS:  Please see the history of present illness.   His sister is with him today. She states that he has a history of apnea during sleep.   All other systems reviewed and negative.   PHYSICAL EXAM: VS:  BP 104/70  Pulse 87  Ht 6' (  1.829 m)  Wt 189 lb 12.8 oz (86.093 kg)  BMI 25.74 kg/m2 Well nourished, well developed, in no acute distress HEENT: normal Neck: no JVD Endocrine: No thyromegaly Cardiac:  normal S1, S2; RRR; no murmur Lungs:  Decreased breath sounds bilaterally, no wheezing, rhonchi or rales Abd: soft, nontender, no hepatomegaly Ext: no edema Skin: warm and dry Neuro:  CNs 2-12 intact, no focal abnormalities noted  EKG:  NSR, HR 87, normal axis, nonspecific ST-T wave changes     ASSESSMENT AND PLAN:  1. Atrial Fibrillation:  Maintaining NSR.continue diltiazem and Xarelto. I will refer him to the anticoagulation clinic for education on NOACs. He does have a history of witnessed apneic episodes. I will refer him for sleep  study. 2. Hypertension:  Controlled. 3. Dyspnea:  Plan ETT-Myoview as recommended.  Prior smoker.  He should f/u primary care to investigate possibility of COPD.  I will refer him to primary care. 4. Disposition:  Follow up with Dr. Johney FrameAllred in 2 months.  He may return to work (he is sedentary).    Signed, Tereso NewcomerScott Emaly Boschert, PA-C  02/22/2014 8:56 AM

## 2014-02-27 ENCOUNTER — Ambulatory Visit: Payer: Self-pay | Admitting: Family Medicine

## 2014-03-09 ENCOUNTER — Telehealth: Payer: Self-pay | Admitting: *Deleted

## 2014-03-09 NOTE — Telephone Encounter (Signed)
PA to [facility] for patients xarelto 

## 2014-03-13 ENCOUNTER — Encounter: Payer: Self-pay | Admitting: Cardiology

## 2014-03-13 ENCOUNTER — Ambulatory Visit (HOSPITAL_COMMUNITY): Payer: Medicare Other | Attending: Cardiology | Admitting: Radiology

## 2014-03-13 VITALS — BP 142/82 | HR 69 | Ht 72.0 in | Wt 191.0 lb

## 2014-03-13 DIAGNOSIS — R0602 Shortness of breath: Secondary | ICD-10-CM

## 2014-03-13 DIAGNOSIS — I4891 Unspecified atrial fibrillation: Secondary | ICD-10-CM | POA: Insufficient documentation

## 2014-03-13 DIAGNOSIS — I10 Essential (primary) hypertension: Secondary | ICD-10-CM

## 2014-03-13 MED ORDER — TECHNETIUM TC 99M SESTAMIBI GENERIC - CARDIOLITE
30.0000 | Freq: Once | INTRAVENOUS | Status: AC | PRN
  Administered 2014-03-13: 30 via INTRAVENOUS

## 2014-03-13 MED ORDER — TECHNETIUM TC 99M SESTAMIBI GENERIC - CARDIOLITE
10.0000 | Freq: Once | INTRAVENOUS | Status: AC | PRN
  Administered 2014-03-13: 10 via INTRAVENOUS

## 2014-03-13 MED ORDER — REGADENOSON 0.4 MG/5ML IV SOLN
0.4000 mg | Freq: Once | INTRAVENOUS | Status: AC
  Administered 2014-03-13: 0.4 mg via INTRAVENOUS

## 2014-03-13 NOTE — Telephone Encounter (Signed)
[facility] approved patients xarelto through 03/09/2015. PA # [id number]

## 2014-03-13 NOTE — Progress Notes (Signed)
[facility] SITE 3 NUCLEAR MED [address] [place], [place] [address] [phone]    Cardiology Nuclear Med Study  Kenneth Murphy is a 68 y.o. male     MRN : [id number]     DOB: 05/29/1946  Procedure Date: 03/13/2014  Nuclear Med Background Indication for Stress Test:  Evaluation for Ischemia and New AFIB, Dyspnea History:  No known CAD, Afib with RVR, Echo 2015 EF 60-65%, bronchitis Cardiac Risk Factors: Family History - CAD and History of Smoking  Symptoms:  DOE and Palpitations   Nuclear Pre-Procedure Caffeine/Decaff Intake:  None > 12 hrs NPO After: 3:00pm yesterday   Lungs:  clear O2 Sat: 95% on room air. IV 0.9% NS with Angio Cath:  22g  IV Site: R Antecubital x 1, tolerated well IV Started by:  Kenneth HongPatsy Edwards, RN  Chest Size (in):  40 Cup Size: n/a  Height: 6' (1.829 m)  Weight:  191 lb (86.637 kg)  BMI:  Body mass index is 25.9 kg/(m^2). Tech Comments:  Patient took Diltiazem on arrival at 791025 today. Kenneth HongPatsy Edwards, RN.    Nuclear Med Study 1 or 2 day study: 1 day  Stress Test Type:  Kenneth BirksLexiscan  Reading MD: N/A  Order Authorizing Provider:  Hillis RangeJames Allred, MD, and Kenneth Murphy  Resting Radionuclide: Technetium 4629m Sestamibi  Resting Radionuclide Dose: 11.0 mCi   Stress Radionuclide:  Technetium 7029m Sestamibi  Stress Radionuclide Dose: 33.0 mCi           Stress Protocol Rest HR: 69 Stress HR: 96  Rest BP: 142/82 Stress BP: 145/86  Exercise Time (min): n/a METS: n/a           Dose of Adenosine (mg):  n/a Dose of Lexiscan: 0.4 mg  Dose of Atropine (mg): n/a Dose of Dobutamine: n/a mcg/kg/min (at max HR)  Stress Test Technologist: Kenneth Murphy  Nuclear Technologist:  Kenneth Murphy     Rest Procedure:  Myocardial perfusion imaging was performed at rest 45 minutes following the intravenous administration of Technetium 5429m Sestamibi. Rest ECG: NSR - Normal EKG  Stress Procedure:  The patient received IV Lexiscan 0.4 mg over 15-seconds.   Technetium 7529m Sestamibi injected at 30-seconds.  Quantitative spect images were obtained after a 45 minute delay.  During the infusion of Lexiscan, the patient complained of feeling SOB and lightheadedness.  These symptoms began to ease with recovery.  Stress ECG: No significant change from baseline ECG  QPS Raw Data Images:  Patient motion noted. Stress Images:  Normal homogeneous uptake in all areas of the myocardium. Rest Images:  Normal homogeneous uptake in all areas of the myocardium. Subtraction (SDS):  No evidence of ischemia. Transient Ischemic Dilatation (Normal <1.22):  0.97 Lung/Heart Ratio (Normal <0.45):  0.31  Quantitative Gated Spect Images QGS EDV:  81 ml QGS ESV:  29 ml  Impression Exercise Capacity:  Lexiscan with no exercise. BP Response:  Normal blood pressure response. Clinical Symptoms:  There is dyspnea. ECG Impression:  No significant ST segment change suggestive of ischemia. Comparison with Prior Nuclear Study: No images to compare  Overall Impression:  Normal stress nuclear study.  LV Ejection Fraction: 64%.  LV Wall Motion:  NL LV Function; NL Wall Motion   Kenneth Murphy

## 2014-03-14 ENCOUNTER — Telehealth: Payer: Self-pay | Admitting: *Deleted

## 2014-03-14 ENCOUNTER — Encounter: Payer: Self-pay | Admitting: Physician Assistant

## 2014-03-14 NOTE — Telephone Encounter (Signed)
pt notified about myoview results with verbal understanding 

## 2014-03-16 ENCOUNTER — Encounter (HOSPITAL_BASED_OUTPATIENT_CLINIC_OR_DEPARTMENT_OTHER): Payer: Medicare Other

## 2014-03-27 ENCOUNTER — Ambulatory Visit (INDEPENDENT_AMBULATORY_CARE_PROVIDER_SITE_OTHER): Payer: Medicare Other | Admitting: Pharmacist

## 2014-03-27 DIAGNOSIS — Z79899 Other long term (current) drug therapy: Secondary | ICD-10-CM

## 2014-03-27 DIAGNOSIS — I4891 Unspecified atrial fibrillation: Secondary | ICD-10-CM

## 2014-03-27 LAB — CBC
HEMATOCRIT: 43 % (ref 39.0–52.0)
HEMOGLOBIN: 14.3 g/dL (ref 13.0–17.0)
MCHC: 33.3 g/dL (ref 30.0–36.0)
MCV: 92.8 fl (ref 78.0–100.0)
PLATELETS: 97 10*3/uL — AB (ref 150.0–400.0)
RBC: 4.63 Mil/uL (ref 4.22–5.81)
RDW: 14.7 % — AB (ref 11.5–14.6)
WBC: 8.6 10*3/uL (ref 4.5–10.5)

## 2014-03-27 LAB — BASIC METABOLIC PANEL
BUN: 15 mg/dL (ref 6–23)
CHLORIDE: 108 meq/L (ref 96–112)
CO2: 23 mEq/L (ref 19–32)
Calcium: 9.3 mg/dL (ref 8.4–10.5)
Creatinine, Ser: 0.9 mg/dL (ref 0.4–1.5)
GFR: 89.32 mL/min (ref >60.00)
GLUCOSE: 100 mg/dL — AB (ref 70–99)
POTASSIUM: 4.2 meq/L (ref 3.5–5.1)
Sodium: 139 mEq/L (ref 135–145)

## 2014-03-27 NOTE — Progress Notes (Signed)
Pt was started on Xarelto 20 mg once daily for Afib on 02/03/14 in the hospital [facility]  He has a h/o heavy alcohol use.    Reviewed patients medication list.  Pt is not currently on any combined P-gp and strong CYP3A4 inhibitors/inducers (ketoconazole, traconazole, ritonavir, carbamazepine, phenytoin, rifampin, St. John's wort).  Labs were normal 2 months ago in the hospital, and he is tolerating Xarelto well.  Will recheck BMET and CBC today. Reviewed labs.  SCr 0.9 mg/dL, Weight 86 kg, CrCl- 90 ml/min.  Dose appropriate based on CrCl.   Hgb and HCT Within Normal Limits .  Results called to patient at home [phone]).  A full discussion of the nature of anticoagulants has been carried out.  A benefit/risk analysis has been presented to the patient, so that they understand the justification for choosing anticoagulation with Xarelto at this time.  The need for compliance is stressed.  Pt is aware to take the medication once daily with the largest meal of the day.  Side effects of potential bleeding are discussed, including unusual colored urine or stools, coughing up blood or coffee ground emesis, nose bleeds or serious fall or head trauma.  Discussed signs and symptoms of stroke. The patient should avoid any OTC items containing aspirin or ibuprofen.  Avoid alcohol consumption as I explained that alcohol use can be bad not only for bleeding risk, but also for Afib.   Call if any signs of abnormal bleeding.  Discussed financial obligations and resolved any difficulty in obtaining medication - he pays a $45 copay which is okay with him.  Sees Dr. Johney FrameAllred next month.

## 2014-04-26 ENCOUNTER — Encounter: Payer: Self-pay | Admitting: Internal Medicine

## 2014-04-26 ENCOUNTER — Encounter (INDEPENDENT_AMBULATORY_CARE_PROVIDER_SITE_OTHER): Payer: Self-pay

## 2014-04-26 ENCOUNTER — Ambulatory Visit (INDEPENDENT_AMBULATORY_CARE_PROVIDER_SITE_OTHER): Payer: Medicare Other | Admitting: Internal Medicine

## 2014-04-26 VITALS — BP 124/87 | HR 75 | Ht 72.0 in | Wt 195.8 lb

## 2014-04-26 DIAGNOSIS — I4891 Unspecified atrial fibrillation: Secondary | ICD-10-CM

## 2014-04-26 DIAGNOSIS — I10 Essential (primary) hypertension: Secondary | ICD-10-CM

## 2014-04-26 MED ORDER — METOPROLOL SUCCINATE ER 25 MG PO TB24
25.0000 mg | ORAL_TABLET | Freq: Every day | ORAL | Status: DC
Start: 2014-04-26 — End: 2015-01-19

## 2014-04-26 NOTE — Progress Notes (Signed)
   Kenneth Murphy is a 68 y.o. male who presents today for routine electrophysiology followup.  Since last being seen in our clinic, the patient reports doing very well.  He had afib in the setting of pneumonia 2/15.  He has done well and has maintained sinus rhythm recently.  His primary concern is with costs of medicine.  Today, he denies symptoms of palpitations, chest pain, shortness of breath,  lower extremity edema, dizziness, presyncope, or syncope.  The patient is otherwise without complaint today.   Past Medical History  Diagnosis Date  . Atrial fibrillation     new onset 02/03/2014; CHADS2-VASC=2 (Xarelto started)  . Walking pneumonia 1970's  . Arthritis     "mainly in my back; shoulders, ankles" (02/03/2014)  . Chronic back pain     "mainly small of my back; got degenerative disc there" (02/03/2014)  . Depression 1970's  . Hx of echocardiogram     a. Echo (02/04/14):  EF 60-65%, no RWMA, mild RAE, PASP 35.  Marland Kitchen. Hx of cardiovascular stress test     a. Lexiscan Myoview (02/2014): No ischemia; EF 64%; normal study.   Past Surgical History  Procedure Laterality Date  . Inguinal hernia repair Left 2007    Current Outpatient Prescriptions  Medication Sig Dispense Refill  . diltiazem (CARDIZEM CD) 360 MG 24 hr capsule Take 1 capsule (360 mg total) by mouth daily.  30 capsule  5  . Rivaroxaban (XARELTO) 20 MG TABS tablet Take 1 tablet (20 mg total) by mouth daily with supper.  30 tablet  5   No current facility-administered medications for this visit.    Physical Exam: Filed Vitals:   04/26/14 1015  BP: 124/87  Pulse: 75  Height: 6' (1.829 m)  Weight: 195 lb 12.8 oz (88.814 kg)    GEN- The patient is well appearing, alert and oriented x 3 today.   Head- normocephalic, atraumatic Eyes-  Sclera clear, conjunctiva pink Ears- hearing intact Oropharynx- clear Lungs- Clear to ausculation bilaterally, normal work of breathing Heart- Regular rate and rhythm, no murmurs, rubs or  gallops, PMI not laterally displaced GI- soft, NT, ND, + BS Extremities- no clubbing, cyanosis, or edema Anxious affect  ekg today reveals sinus rhythm 75bpm, normal ekg Echo and stress test are reviewed  Assessment and Plan:  1. afib Maintaining sinus off of AAD chads2vasc is 2. Continue xarelto Stop diltiazem and start generic metoprolol succinate 25mg  daily per patient request due to costs  2. htn Stable No change required today  Return to see Tereso NewcomerScott Weaver in 3 months I will see in 6 months

## 2014-04-26 NOTE — Patient Instructions (Signed)
Your physician recommends that you schedule a follow-up appointment in: 3 months with Tereso NewcomerScott Weaver, PA and 6 months with Dr Johney FrameAllred  Your physician has recommended you make the following change in your medication:  1) Stop Diltiazem  2) Start Metoprolol 25mg  daily

## 2014-07-26 ENCOUNTER — Telehealth: Payer: Self-pay

## 2014-07-26 ENCOUNTER — Encounter: Payer: Self-pay | Admitting: Physician Assistant

## 2014-07-26 ENCOUNTER — Ambulatory Visit (INDEPENDENT_AMBULATORY_CARE_PROVIDER_SITE_OTHER): Payer: Medicare Other | Admitting: Physician Assistant

## 2014-07-26 VITALS — BP 110/79 | HR 71 | Ht 72.0 in | Wt 198.0 lb

## 2014-07-26 DIAGNOSIS — I10 Essential (primary) hypertension: Secondary | ICD-10-CM

## 2014-07-26 DIAGNOSIS — I480 Paroxysmal atrial fibrillation: Secondary | ICD-10-CM

## 2014-07-26 DIAGNOSIS — I4891 Unspecified atrial fibrillation: Secondary | ICD-10-CM

## 2014-07-26 DIAGNOSIS — Z7901 Long term (current) use of anticoagulants: Secondary | ICD-10-CM

## 2014-07-26 LAB — CBC WITH DIFFERENTIAL/PLATELET
BASOS PCT: 0.5 % (ref 0.0–3.0)
Basophils Absolute: 0 10*3/uL (ref 0.0–0.1)
EOS PCT: 1.5 % (ref 0.0–5.0)
Eosinophils Absolute: 0.1 10*3/uL (ref 0.0–0.7)
HCT: 46 % (ref 39.0–52.0)
Hemoglobin: 15.4 g/dL (ref 13.0–17.0)
LYMPHS PCT: 39.1 % (ref 12.0–46.0)
Lymphs Abs: 3 10*3/uL (ref 0.7–4.0)
MCHC: 33.4 g/dL (ref 30.0–36.0)
MCV: 91.5 fl (ref 78.0–100.0)
Monocytes Absolute: 0.9 10*3/uL (ref 0.1–1.0)
Monocytes Relative: 11.1 % (ref 3.0–12.0)
NEUTROS PCT: 47.8 % (ref 43.0–77.0)
Neutro Abs: 3.7 10*3/uL (ref 1.4–7.7)
PLATELETS: 170 10*3/uL (ref 150.0–400.0)
RBC: 5.02 Mil/uL (ref 4.22–5.81)
RDW: 14.4 % (ref 11.5–15.5)
WBC: 7.7 10*3/uL (ref 4.0–10.5)

## 2014-07-26 LAB — BASIC METABOLIC PANEL
BUN: 12 mg/dL (ref 6–23)
CO2: 23 mEq/L (ref 19–32)
CREATININE: 0.9 mg/dL (ref 0.4–1.5)
Calcium: 9.7 mg/dL (ref 8.4–10.5)
Chloride: 108 mEq/L (ref 96–112)
GFR: 84.87 mL/min (ref >60.00)
Glucose, Bld: 99 mg/dL (ref 70–99)
Potassium: 4.1 mEq/L (ref 3.5–5.1)
Sodium: 138 mEq/L (ref 135–145)

## 2014-07-26 MED ORDER — RIVAROXABAN 20 MG PO TABS: 20.0000 mg | ORAL_TABLET | Freq: Every day | ORAL | Status: DC

## 2014-07-26 NOTE — Progress Notes (Signed)
Cardiology Office Note   Date:  07/26/2014   ID:  Kenneth Murphy, DOB 1946-08-07, MRN [id number]  PCP:  No PCP Per Patient  Cardiologist:  Dr. Hillis Range >>> Dr. Rollene Rotunda    History of Present Illness: Kenneth Murphy is a 68 y.o. male smoker with a hx of heavy ETOH use, COPD, paroxysmal AFib.  CHADS2-VASc=2 (HTN, age > 97) >>> Xarelto. Last seen by Dr. Hillis Range in 04/2014.  Diltiazem was changed to Toprol-XL due to cost.  He returns for follow up.  He is overall doing well.  Notes occasional gum bleeding.  He is not sure he will be able to afford Xarelto long term.  Has chronic DOE.  He is NYHA 2b.  Denies significant change.  No orthopnea, PND, edema, chest pain, syncope.    Studies:  - Echo (02/04/14):  EF 60-65%, no RWMA, mild RAE, PASP 35.  - Nuclear (3/15):  No ischemia, EF 64%, Normal Study   Recent Labs: 02/03/2014: HDL Cholesterol by NMR 50; LDL (calc) 101*; Pro B Natriuretic peptide (BNP) 1042.0*  02/05/2014: ALT 20; TSH 0.953  03/27/2014: Creatinine 0.9; Hemoglobin 14.3; Potassium 4.2   Wt Readings from Last 3 Encounters:  07/26/14 198 lb (89.812 kg)  04/26/14 195 lb 12.8 oz (88.814 kg)  03/13/14 191 lb (86.637 kg)     Past Medical History  Diagnosis Date  . Atrial fibrillation     new onset 02/03/2014; CHADS2-VASC=2 (Xarelto started)  . Walking pneumonia 1970's  . Arthritis     "mainly in my back; shoulders, ankles" (02/03/2014)  . Chronic back pain     "mainly small of my back; got degenerative disc there" (02/03/2014)  . Depression 1970's  . Hx of echocardiogram     a. Echo (02/04/14):  EF 60-65%, no RWMA, mild RAE, PASP 35.  Marland Kitchen Hx of cardiovascular stress test     a. Lexiscan Myoview (02/2014): No ischemia; EF 64%; normal study.    Current Outpatient Prescriptions  Medication Sig Dispense Refill  . metoprolol succinate (TOPROL-XL) 25 MG 24 hr tablet Take 1 tablet (25 mg total) by mouth daily.  90 tablet  3  . Rivaroxaban (XARELTO) 20 MG TABS tablet Take 1  tablet (20 mg total) by mouth daily with supper.  30 tablet  5   No current facility-administered medications for this visit.    Allergies:   Review of patient's allergies indicates no known allergies.   Social History:  The patient  reports that he quit smoking about 5 months ago. His smoking use included Cigarettes. He has a 55 pack-year smoking history. He has never used smokeless tobacco. He reports that he drinks alcohol. He reports that he does not use illicit drugs.   Family History:  The patient's family history includes Atrial fibrillation in an other family member; Heart attack in his father.   ROS:  Please see the history of present illness.   Denies melena, hematochezia, hematuria.   All other systems reviewed and negative.   PHYSICAL EXAM: VS:  BP 110/79  Pulse 71  Ht 6' (1.829 m)  Wt 198 lb (89.812 kg)  BMI 26.85 kg/m2 Well nourished, well developed, in no acute distress HEENT: normal Neck: no JVD Cardiac:  normal S1, S2; RRR; no murmur Lungs:  Decreased breath sounds bilaterally, no wheezing, rhonchi or rales Abd: soft, nontender, no hepatomegaly Ext: no edema Skin: warm and dry Neuro:  CNs 2-12 intact, no focal abnormalities noted  EKG:  NSR, HR 71,  normal axis, PRWP, NSSTTW changes    ASSESSMENT AND PLAN:  Atrial Fibrillation:  Maintaining NSR.  He is worried about his gums bleeding at times.  Will check CBC and BMET.  Also concerned about cost.  We discussed Warfarin as an alternative. He will try to stick with Xarelto for now.  Will check into assistance through the drug company.  Will also provide samples.   Hypertension:  Controlled.   Disposition:  [place] in [place] and would prefer to follow up there.  Will arrange f/u with Dr. Rollene [place]James Hochrein [place] in 3 mos.   Signed, Brynda RimScott Billal Rollo, PA-C, MHS 07/26/2014 9:18 AM    [facility] [address], [place], [place]  [address] Phone: [phone]; Fax: [phone]

## 2014-07-26 NOTE — Telephone Encounter (Signed)
pt aware form to be completed for pt assistance with Sherrilyn RistXraelto mailed to pt. pt should complete pt info and bring form to our office to be submitted.

## 2014-07-26 NOTE — Patient Instructions (Signed)
Your physician recommends that you continue on your current medications as directed. Please refer to the Current Medication list given to you today.  A refill for Xarelto 20mg  daily has been sent to your pharmacy  Lab Today: Bmet, Cbc  Your physician recommends that you schedule a follow-up appointment in: 3 months with Dr.[place]ochrein in the [place] office

## 2014-07-27 NOTE — Telephone Encounter (Signed)
no answer x 2 on pt's home. I called pt's sister and s/w brother-in-law who said pt does not have an answering machine, but he will let him know of the lab results. I advised lab work good, normal, if pt has any questions to call [phone].

## 2014-07-27 NOTE — Telephone Encounter (Signed)
no answer I will try again later 

## 2014-08-14 ENCOUNTER — Telehealth: Payer: Self-pay | Admitting: Internal Medicine

## 2014-08-14 NOTE — Telephone Encounter (Signed)
Walk in pt Form " Pt Assistance Application" Dropped Off gave to [facility] 8.24.15/km

## 2014-09-15 ENCOUNTER — Telehealth: Payer: Self-pay | Admitting: Internal Medicine

## 2014-09-15 NOTE — Telephone Encounter (Signed)
Patient Assistance paper Needs Patient Signature Before it can be Processed called pt this am Explained this to him, he stated he was not Driving up here to sign this to place it in the Mail to His Home address. I also let him know it needs to be sent back in 2 weeks if he could Sign and mail back Quickly.

## 2014-09-20 IMAGING — CR DG CHEST 1V PORT
1 series · 1 of 1 positions shown · non-contrast
Comparison: 01/08/2014 and 08/25/2006

CLINICAL DATA: Shortness of breath.  Atrial fibrillation.

EXAM:
PORTABLE CHEST - 1 VIEW

[AP]
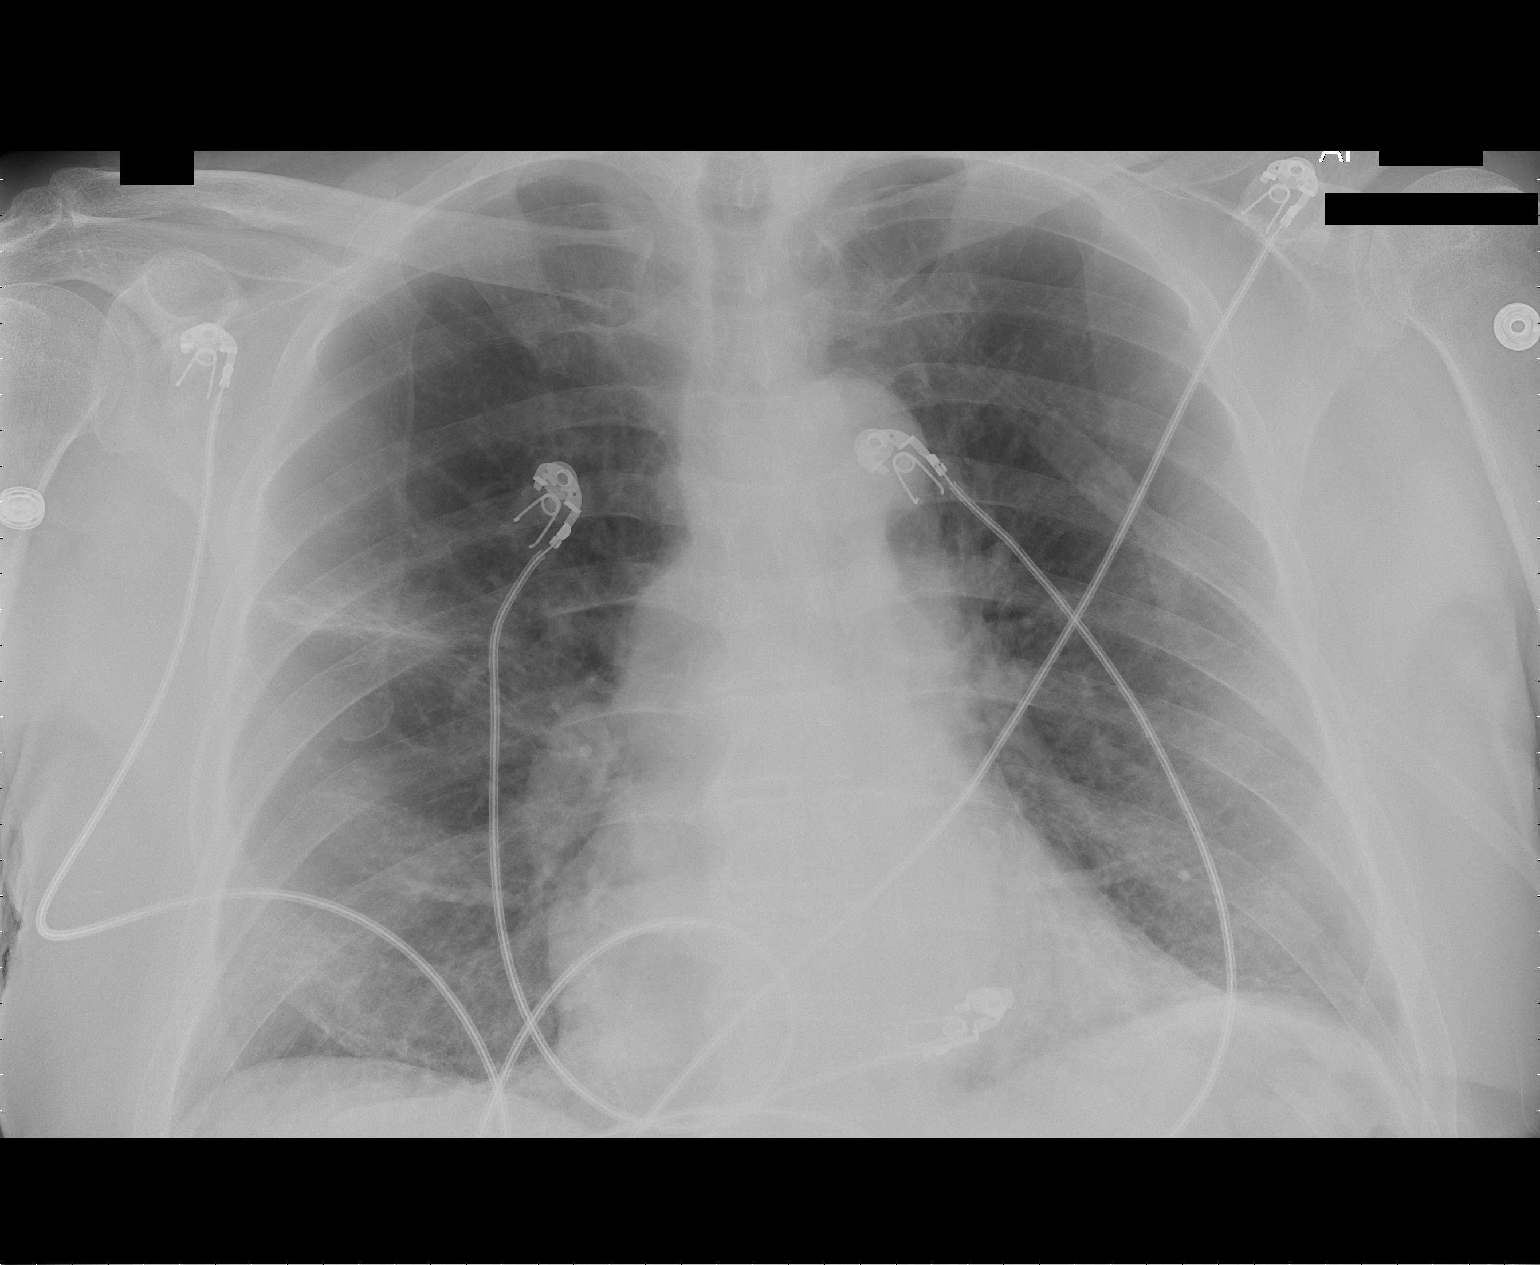

[1 of 1 positions shown; findings below may reference images not displayed]

FINDINGS: Heart size and pulmonary vascularity are normal. There is focal
slight atelectasis in the right midzone. Lungs are otherwise clear.
No acute osseous abnormality.
IMPRESSION: Focal linear atelectasis in the right midzone.

## 2014-11-01 ENCOUNTER — Encounter: Payer: Self-pay | Admitting: Cardiology

## 2014-11-01 ENCOUNTER — Ambulatory Visit (INDEPENDENT_AMBULATORY_CARE_PROVIDER_SITE_OTHER): Payer: Medicare Other | Admitting: Cardiology

## 2014-11-01 VITALS — BP 118/80 | HR 95 | Ht 72.0 in | Wt 202.0 lb

## 2014-11-01 DIAGNOSIS — I10 Essential (primary) hypertension: Secondary | ICD-10-CM

## 2014-11-01 DIAGNOSIS — I480 Paroxysmal atrial fibrillation: Secondary | ICD-10-CM

## 2014-11-01 NOTE — Progress Notes (Signed)
    HPI The patient presents for evaluation of atrial fibrillation. He is being managed with anticoagulation.  He is following up with me because he lives here. He is in atrial fibrillation daily although he does not notice this.  He was in sinus rhythm on the recent EKG. He doesn't feel any palpitations, presyncope or syncope. He's not having any chest pressure, neck or arm discomfort. He's not having any weight gain or edema. He says he stop drinking alcohol and smoking cigarettes. He doesn't think he'll be able to afford Xarelto long-term.  He is limited by joint pain and chronic lung disease.  No Known Allergies  Current Outpatient Prescriptions  Medication Sig Dispense Refill  . metoprolol succinate (TOPROL-XL) 25 MG 24 hr tablet Take 1 tablet (25 mg total) by mouth daily. 90 tablet 3  . rivaroxaban (XARELTO) 20 MG TABS tablet Take 1 tablet (20 mg total) by mouth daily with supper. 30 tablet 5   No current facility-administered medications for this visit.    Past Medical History  Diagnosis Date  . Atrial fibrillation     new onset 02/03/2014; CHADS2-VASC=2 (Xarelto started)  . Walking pneumonia 1970's  . Arthritis     "mainly in my back; shoulders, ankles" (02/03/2014)  . Chronic back pain     "mainly small of my back; got degenerative disc there" (02/03/2014)  . Depression 1970's  . Hx of echocardiogram     a. Echo (02/04/14):  EF 60-65%, no RWMA, mild RAE, PASP 35.  Marland Kitchen. Hx of cardiovascular stress test     a. Lexiscan Myoview (02/2014): No ischemia; EF 64%; normal study.    Past Surgical History  Procedure Laterality Date  . Inguinal hernia repair Left 2007    ROS:  Arthritis, gums bleed.  Otherwise as stated in the HPI and negative for all other systems.  PHYSICAL EXAM BP 118/80 mmHg  Pulse 95  Ht 6' (1.829 m)  Wt 202 lb (91.627 kg)  BMI 27.39 kg/m2 GENERAL:  Well appearing NECK:  No jugular venous distention, waveform within normal limits, carotid upstroke brisk and  symmetric, no bruits, no thyromegaly LUNGS:  Clear to auscultation bilaterally BACK:  No CVA tenderness CHEST:  Unremarkable HEART:  PMI not displaced or sustained,S1 and S2 within normal limits, no S3, no clicks, no rubs, no murmurs, irregular ABD:  Flat, positive bowel sounds normal in frequency in pitch, no bruits, no rebound, no guarding, no midline pulsatile mass, no hepatomegaly, no splenomegaly EXT:  2 plus pulses throughout, no edema, no cyanosis no clubbing  EKG:  Atrial fibrillation, rate 95, axis within normal limits, intervals within normal limits, poor anterior R wave progression, inferior T-wave inversions which is new compared with previous. 11/01/2014   ASSESSMENT AND PLAN   ATRIAL FIB:  CHADS2-VASc=2 (HTN, age > 7965).  He understands the importance of anticoagulation. He will continue his Xarelto Levi AlandLahey will try to establish as a new patient with Dr. Christell ConstantMoore.  He was then switched to warfarin.  ABNORMAL EKG:  He had a negative stress perfusion study in March without any evidence of ischemia.  No change in therapy is indicated.

## 2014-11-01 NOTE — Patient Instructions (Signed)
The current medical regimen is effective;  continue present plan and medications.  Follow up in 6 months with Dr. Hochrein in [place].  You will receive a letter in the mail 2 months before you are due.  Please call us when you receive this letter to schedule your follow up appointment.  

## 2015-01-19 ENCOUNTER — Other Ambulatory Visit: Payer: Self-pay | Admitting: Internal Medicine

## 2015-03-28 ENCOUNTER — Other Ambulatory Visit: Payer: Self-pay | Admitting: Cardiology

## 2015-04-30 ENCOUNTER — Other Ambulatory Visit: Payer: Self-pay | Admitting: Cardiology

## 2015-06-27 ENCOUNTER — Encounter: Payer: Self-pay | Admitting: Cardiology

## 2015-06-27 ENCOUNTER — Ambulatory Visit (INDEPENDENT_AMBULATORY_CARE_PROVIDER_SITE_OTHER): Payer: Self-pay | Admitting: Cardiology

## 2015-06-27 VITALS — BP 124/82 | HR 84 | Ht 72.0 in | Wt 209.0 lb

## 2015-06-27 DIAGNOSIS — I10 Essential (primary) hypertension: Secondary | ICD-10-CM

## 2015-06-27 MED ORDER — RIVAROXABAN 20 MG PO TABS: ORAL_TABLET | ORAL | Status: DC

## 2015-06-27 MED ORDER — METOPROLOL SUCCINATE ER 25 MG PO TB24: 25.0000 mg | ORAL_TABLET | Freq: Every day | ORAL | Status: DC

## 2015-06-27 NOTE — Patient Instructions (Signed)
Medication Instructions:  Your physician recommends that you continue on your current medications as directed. Please refer to the Current Medication list given to you today.  Follow-Up: Follow up in 1 year with Dr. Hochrein in [place].  You will receive a letter in the mail 2 months before you are due.  Please call us when you receive this letter to schedule your follow up appointment.  Thank you for choosing [facility]!!       

## 2015-06-27 NOTE — Progress Notes (Signed)
    HPI The patient presents for evaluation of atrial fibrillation.  He was in sinus rhythm on the recent EKG and is in sinus again today.  Since I last saw him he has no new complaints. He has been taking his Xarelto but only every other day. He said he had bleeding in his mouth when he was taking it daily. He's not noticed any new palpitations, presyncope or syncope. He has had no new chest pressure, neck or arm discomfort. He's limited in his activities because of joint pain and lung disease.    No Known Allergies  Current Outpatient Prescriptions  Medication Sig Dispense Refill  . metoprolol succinate (TOPROL-XL) 25 MG 24 hr tablet TAKE 1 TABLET BY MOUTH ONCE DAILY 90 tablet 0  . XARELTO 20 MG TABS tablet TAKE 1 TABLET DAILY WITH SUPPER 30 tablet 5   No current facility-administered medications for this visit.    Past Medical History  Diagnosis Date  . Atrial fibrillation     new onset 02/03/2014; CHADS2-VASC=2 (Xarelto started)  . Walking pneumonia 1970's  . Arthritis     "mainly in my back; shoulders, ankles" (02/03/2014)  . Chronic back pain     "mainly small of my back; got degenerative disc there" (02/03/2014)  . Depression 1970's  . Hx of echocardiogram     a. Echo (02/04/14):  EF 60-65%, no RWMA, mild RAE, PASP 35.  Marland Kitchen. Hx of cardiovascular stress test     a. Lexiscan Myoview (02/2014): No ischemia; EF 64%; normal study.    Past Surgical History  Procedure Laterality Date  . Inguinal hernia repair Left 2007    ROS:  Arthritis, gums bleed.  Otherwise as stated in the HPI and negative for all other systems.  PHYSICAL EXAM BP 124/82 mmHg  Pulse 84  Ht 6' (1.829 m)  Wt 209 lb (94.802 kg)  BMI 28.34 kg/m2 GENERAL:  Well appearing HEENT:  Oral mucosa unremarkable no scourse of bleeding  NECK:  No jugular venous distention, waveform within normal limits, carotid upstroke brisk and symmetric, no bruits, no thyromegaly LUNGS:  Clear to auscultation bilaterally BACK:  No CVA  tenderness CHEST:  Unremarkable HEART:  PMI not displaced or sustained,S1 and S2 within normal limits, no S3, no clicks, no rubs, no murmurs, irregular ABD:  Flat, positive bowel sounds normal in frequency in pitch, no bruits, no rebound, no guarding, no midline pulsatile mass, no hepatomegaly, no splenomegaly EXT:  2 plus pulses throughout, no edema, no cyanosis no clubbing  EKG:  Normal sinus rhythm, rate 84, premature atrial contractions, axis within normal limits, intervals within normal limits, no acute ST-T wave changes.  06/27/2015  ASSESSMENT AND PLAN   ATRIAL FIB:  CHADS2-VASc=2 (HTN, age > 4165).  He understands the importance of anticoagulation.  I discussed the need with him take this daily and to get his mouth looked at for source of bleeding.

## 2016-07-18 ENCOUNTER — Other Ambulatory Visit: Payer: Self-pay | Admitting: Cardiology

## 2016-07-21 NOTE — Telephone Encounter (Signed)
Rx request sent to pharmacy.  

## 2016-08-14 ENCOUNTER — Encounter: Payer: Self-pay | Admitting: Cardiology

## 2016-08-25 NOTE — Progress Notes (Signed)
HPI The patient presents for evaluation of atrial fibrillation.  He has been sinus on the last couple of visits.  Today he is in fib.  He does not feel his fibrillation.  The patient denies any new symptoms such as neck or arm discomfort. There has been no new shortness of breath, PND or orthopnea. There have been no reported palpitations, presyncope or syncope.  He does report that he has difficulty swallowing foods. In addition he reports burning discomfort when he lies down at night that he blames on his Toprol. He still has some bleeding in his gums if he takes his Xarelto every day and he refuses to take it every day. He never had this evaluated as I suggested. I have been unable see a bleeding source on his gum exam. He's limited in his activities because of joint pain and lung disease.    No Known Allergies  Current Outpatient Prescriptions  Medication Sig Dispense Refill  . metoprolol succinate (TOPROL-XL) 25 MG 24 hr tablet TAKE 1 TABLET BY MOUTH ONCE DAILY 90 tablet 3  . XARELTO 20 MG TABS tablet TAKE 1 TABLET DAILY WITH SUPPER 30 tablet 2   No current facility-administered medications for this visit.     Past Medical History:  Diagnosis Date  . Arthritis    "mainly in my back; shoulders, ankles" (02/03/2014)  . Atrial fibrillation (HCC)    new onset 02/03/2014; CHADS2-VASC=2 (Xarelto started)  . Chronic back pain    "mainly small of my back; got degenerative disc there" (02/03/2014)  . Depression 1970's  . Hx of cardiovascular stress test    a. Lexiscan Myoview (02/2014): No ischemia; EF 64%; normal study.  Marland Kitchen Hx of echocardiogram    a. Echo (02/04/14):  EF 60-65%, no RWMA, mild RAE, PASP 35.  . Walking pneumonia 1970's    Past Surgical History:  Procedure Laterality Date  . INGUINAL HERNIA REPAIR Left 2007    ROS:  Positive for difficulty swallowing.  Otherwise as stated in the HPI and negative for all other systems.  PHYSICAL EXAM BP 100/78   Pulse 72   Ht 6'  (1.829 m)   Wt 210 lb (95.3 kg)   BMI 28.48 kg/m  GENERAL:  Well appearing NECK:  No jugular venous distention, waveform within normal limits, carotid upstroke brisk and symmetric, no bruits, no thyromegaly LUNGS:  Clear to auscultation bilaterally CHEST:  Unremarkable HEART:  PMI not displaced or sustained,S1 and S2 within normal limits, no S3, no clicks, no rubs, no murmurs, irregular ABD:  Flat, positive bowel sounds normal in frequency in pitch, no bruits, no rebound, no guarding, no midline pulsatile mass, no hepatomegaly, no splenomegaly EXT:  2 plus pulses throughout, no edema, no cyanosis no clubbing  EKG:  Normal sinus rhythm, rate 104, premature atrial contractions, axis within normal limits, intervals within normal limits, no acute ST-T wave changes.  08/27/2016  ASSESSMENT AND PLAN   ATRIAL FIB:  CHADS2-VASc=2 (HTN, age > 26).  He understands the importance of anticoagulation.  I discussed the need with him take this daily and to get his mouth looked at for source of bleeding.  I talked about taking his Xarelto with meals. He really refuses to comply with any suggestions such as getting his teeth look at. He knows that he is at increased risk because he is taking his Xarelto every other day. He's really quite noncompliant.   I will check a CBC.   CHEST PAIN:  He had  a negative stress test as above. His pain sounds like reflux and dysphagia. I encouraged him to see a GI specialist but he doesn't want to do this. He refuses to get a primary care doctor.  NONCOMPLIANCE:  He is at high risk for poor health outcomes.  He and I have had this discussion.

## 2016-08-27 ENCOUNTER — Ambulatory Visit (INDEPENDENT_AMBULATORY_CARE_PROVIDER_SITE_OTHER): Payer: Medicare Other | Admitting: Cardiology

## 2016-08-27 ENCOUNTER — Other Ambulatory Visit: Payer: Self-pay

## 2016-08-27 ENCOUNTER — Encounter: Payer: Self-pay | Admitting: Cardiology

## 2016-08-27 VITALS — BP 100/78 | HR 72 | Ht 72.0 in | Wt 210.0 lb

## 2016-08-27 DIAGNOSIS — I10 Essential (primary) hypertension: Secondary | ICD-10-CM

## 2016-08-27 DIAGNOSIS — Z79899 Other long term (current) drug therapy: Secondary | ICD-10-CM | POA: Diagnosis not present

## 2016-08-27 DIAGNOSIS — I480 Paroxysmal atrial fibrillation: Secondary | ICD-10-CM

## 2016-08-27 MED ORDER — RIVAROXABAN 20 MG PO TABS: ORAL_TABLET | ORAL | 11 refills | Status: DC

## 2016-08-27 NOTE — Patient Instructions (Signed)
Medication Instructions:  The current medical regimen is effective;  continue present plan and medications.  Labwork: [facility]  Follow-Up: Follow up in 1 year with Dr. Antoine PocheHochrein.  You will receive a letter in the mail 2 months before you are due.  Please call us when you receive this letter to schedule your follow up appointment.  If you need a refill on your cardiac medications before your next appointment, please call your pharmacy.  Thank you for choosing [facility]!!

## 2016-08-28 LAB — CBC
Hematocrit: 47.4 % (ref 37.5–51.0)
Hemoglobin: 16.2 g/dL (ref 12.6–17.7)
MCH: 31.5 pg (ref 26.6–33.0)
MCHC: 34.2 g/dL (ref 31.5–35.7)
MCV: 92 fL (ref 79–97)
Platelets: 210 10*3/uL (ref 150–379)
RBC: 5.14 x10E6/uL (ref 4.14–5.80)
RDW: 13.9 % (ref 12.3–15.4)
WBC: 9.2 10*3/uL (ref 3.4–10.8)

## 2017-08-11 ENCOUNTER — Other Ambulatory Visit: Payer: Self-pay | Admitting: Cardiology

## 2017-09-09 ENCOUNTER — Other Ambulatory Visit: Payer: Self-pay | Admitting: Cardiology

## 2017-09-09 NOTE — Telephone Encounter (Signed)
Need recent BMET

## 2017-09-11 ENCOUNTER — Encounter: Payer: Self-pay | Admitting: Family Medicine

## 2017-09-11 ENCOUNTER — Ambulatory Visit (INDEPENDENT_AMBULATORY_CARE_PROVIDER_SITE_OTHER): Payer: Medicare Other | Admitting: Family Medicine

## 2017-09-11 VITALS — BP 130/84 | HR 82 | Temp 96.9°F | Ht 73.0 in | Wt 211.2 lb

## 2017-09-11 DIAGNOSIS — I480 Paroxysmal atrial fibrillation: Secondary | ICD-10-CM

## 2017-09-11 DIAGNOSIS — F419 Anxiety disorder, unspecified: Secondary | ICD-10-CM

## 2017-09-11 MED ORDER — MIRTAZAPINE 15 MG PO TABS
15.0000 mg | ORAL_TABLET | Freq: Every day | ORAL | 3 refills | Status: DC
Start: 2017-09-11 — End: 2017-10-21

## 2017-09-11 NOTE — Patient Instructions (Signed)
Great to meet you!   Come back in 3-4 weeks to follow up for anxiety  I recommend taking xarelto daily.   Start mirtazapine 1 pill once daily for anxiety.

## 2017-09-11 NOTE — Progress Notes (Signed)
   HPI  Patient presents today here to establish care with anxiety and paroxysmal atrial fibrillation.  Patient states that he does not have any palpitations or racing heart. He takes Xarelto one pill every other day. As noted in his cardiology notes he complains of hard palate and intermittent gum bleeding when he takes it daily. He denies chest pain  Patient states they have a history of pulmonary emphysema and has understandable shortness of breath with activity because of this.  His sister is present with him, she states that he has severe back arthritis, however the patient denies any serious concerns in this.  Anxiety Patient states that he's been having panic attacks for about the last 3 months, her last week or so it's been almost every night. He see an urgent care and started on 15 mg of Remeron with good effect. He denies suicidal thoughts or severe depression.  PMH: Anxiety, arthritis, atrial fibrillation, emphysema Surgical history: Inguinal hernia repair in the left, 2007 Family history: Atrial fibrillation and sister, heart attack and father Social history: Former smoker, quit 2015 ROS: Per HPI  Objective: BP 130/84   Pulse 82   Temp (!) 96.9 F (36.1 C) (Oral)   Ht '6\' 1"'$  (1.854 m)   Wt 211 lb 3.2 oz (95.8 kg)   BMI 27.86 kg/m  Gen: NAD, alert, cooperative with exam HEENT: NCAT, EOMI, PERRL, oropharynx moist and clear, hard palate and soft palate appear normal on my exam, gums are also normal, TMs normal bilaterally CV: Irregularly irregular, normal rate, no murmur appreciated Resp: CTABL, no wheezes, non-labored Ext: No edema, warm Neuro: Alert and oriented, No gross deficits  Assessment and plan:  # Anxiety New-onset anxiety, has been a problem over the years, however more persistent for 3 months. Restart Remeron, follow-up 3-4 weeks   # Paroxysmal atrial fibrillation Likely in atrial fibrillation today, however not confirmed with EKG. Rate  controlled Anticoagulated every other day with Xarelto, discussed reasoning for daily dosing patient states that he will consider this.     Orders Placed This Encounter  Procedures  . CMP14+EGFR  . CBC with Differential/Platelet  . TSH  . LDL Cholesterol, Direct    Meds ordered this encounter  Medications  . mirtazapine (REMERON) 15 MG tablet    Sig: Take 1 tablet (15 mg total) by mouth at bedtime.    Dispense:  30 tablet    Refill:  Carver, MD [facility] 09/11/2017, 1:40 PM

## 2017-09-12 LAB — CMP14+EGFR
ALT: 22 IU/L (ref 0–44)
AST: 23 IU/L (ref 0–40)
Albumin/Globulin Ratio: 2.2 (ref 1.2–2.2)
Albumin: 4.4 g/dL (ref 3.5–4.8)
Alkaline Phosphatase: 64 IU/L (ref 39–117)
BILIRUBIN TOTAL: 0.4 mg/dL (ref 0.0–1.2)
BUN/Creatinine Ratio: 16 (ref 10–24)
BUN: 18 mg/dL (ref 8–27)
CHLORIDE: 102 mmol/L (ref 96–106)
CO2: 24 mmol/L (ref 20–29)
Calcium: 9.5 mg/dL (ref 8.6–10.2)
Creatinine, Ser: 1.14 mg/dL (ref 0.76–1.27)
GFR calc Af Amer: 75 mL/min/{1.73_m2} (ref >59)
GFR calc non Af Amer: 65 mL/min/{1.73_m2} (ref >59)
GLUCOSE: 101 mg/dL — AB (ref 65–99)
Globulin, Total: 2 g/dL (ref 1.5–4.5)
POTASSIUM: 4.9 mmol/L (ref 3.5–5.2)
Sodium: 139 mmol/L (ref 134–144)
Total Protein: 6.4 g/dL (ref 6.0–8.5)

## 2017-09-12 LAB — CBC WITH DIFFERENTIAL/PLATELET
BASOS ABS: 0 10*3/uL (ref 0.0–0.2)
Basos: 0 %
EOS (ABSOLUTE): 0.2 10*3/uL (ref 0.0–0.4)
Eos: 2 %
Hematocrit: 48.1 % (ref 37.5–51.0)
Hemoglobin: 16 g/dL (ref 13.0–17.7)
IMMATURE GRANS (ABS): 0 10*3/uL (ref 0.0–0.1)
IMMATURE GRANULOCYTES: 0 %
LYMPHS: 36 %
Lymphocytes Absolute: 2.9 10*3/uL (ref 0.7–3.1)
MCH: 31.1 pg (ref 26.6–33.0)
MCHC: 33.3 g/dL (ref 31.5–35.7)
MCV: 94 fL (ref 79–97)
MONOCYTES: 11 %
Monocytes Absolute: 0.9 10*3/uL (ref 0.1–0.9)
NEUTROS PCT: 51 %
Neutrophils Absolute: 4 10*3/uL (ref 1.4–7.0)
PLATELETS: 196 10*3/uL (ref 150–379)
RBC: 5.14 x10E6/uL (ref 4.14–5.80)
RDW: 14.4 % (ref 12.3–15.4)
WBC: 8 10*3/uL (ref 3.4–10.8)

## 2017-09-12 LAB — TSH: TSH: 1.99 u[IU]/mL (ref 0.450–4.500)

## 2017-09-12 LAB — LDL CHOLESTEROL, DIRECT: LDL DIRECT: 101 mg/dL — AB (ref 0–99)

## 2017-09-14 ENCOUNTER — Ambulatory Visit: Payer: Self-pay | Admitting: Family Medicine

## 2017-10-21 ENCOUNTER — Encounter: Payer: Self-pay | Admitting: Family Medicine

## 2017-10-21 ENCOUNTER — Ambulatory Visit (INDEPENDENT_AMBULATORY_CARE_PROVIDER_SITE_OTHER): Payer: Medicare Other | Admitting: Family Medicine

## 2017-10-21 VITALS — BP 111/78 | HR 98 | Temp 95.6°F | Ht 73.0 in | Wt 213.4 lb

## 2017-10-21 DIAGNOSIS — F419 Anxiety disorder, unspecified: Secondary | ICD-10-CM | POA: Diagnosis not present

## 2017-10-21 DIAGNOSIS — K219 Gastro-esophageal reflux disease without esophagitis: Secondary | ICD-10-CM | POA: Diagnosis not present

## 2017-10-21 MED ORDER — MIRTAZAPINE 15 MG PO TABS: 15.0000 mg | ORAL_TABLET | Freq: Every day | ORAL | 3 refills | Status: AC

## 2017-10-21 NOTE — Progress Notes (Signed)
   HPI  Patient presents today Here to follow-up for anxiety.  Patient feels well, he is tolerating the medication very well and states that it is controlling his anxiety very well.  He denies any SI.  Does have frequent heartburn, he uses Mylanta or similar medication on most nights for this.  His sister accompanies him today and states that she is concerned for him.  He is not very open to taking a PPI or getting further evaluation at this time. He states that the liquid helps coat his esophagus and makes it feel much better.  PMH: Smoking status noted ROS: Per HPI  Objective: BP 111/78   Pulse 98   Temp (!) 95.6 F (35.3 C) (Oral)   Ht 6\' 1"  (1.854 m)   Wt 213 lb 6.4 oz (96.8 kg)   BMI 28.15 kg/m  Gen: NAD, alert, cooperative with exam HEENT: NCAT CV: RRR, good S1/S2, no murmur Resp: CTABL, no wheezes, non-labored Ext: No edema, warm Neuro: Alert and oriented, No gross deficits  Assessment and plan:  #Anxiety Doing well with the Remeron, refill Follow-up 3-6 months  #GERD Offered PPI, discussed reasons to see a GI doctor. He does describe a feeling of food getting stuck in his central esophagus with later on difficulty with reflux symptoms while laying down. May benefit from EGD. For now patient would like to defer any of this   Meds ordered this encounter  Medications  . mirtazapine (REMERON) 15 MG tablet    Sig: Take 1 tablet (15 mg total) by mouth at bedtime.    Dispense:  90 tablet    Refill:  3    Murtis SinkSam Bradshaw, MD [facility] 10/21/2017, 10:51 AM

## 2017-10-31 ENCOUNTER — Other Ambulatory Visit: Payer: Self-pay | Admitting: Cardiology

## 2017-12-04 ENCOUNTER — Encounter: Payer: Self-pay | Admitting: Cardiology

## 2017-12-06 NOTE — Progress Notes (Signed)
HPI The patient presents for evaluation of atrial fibrillation.  He has been non compliant with his meds in that he has not taken his Xarelto daily.  He has had gum bleeding but has refused to get his gums looked at.  He has had GI complaints but has refused to get this evaluated.  He returns for yearly follow up.  He continues to refuse to take his Xarelto daily and understands the risk of this.  He has not had any tachypalpitations.  He does describe difficulty swallowing breads and burning chest discomfort that has been chronic for some time.  He says he is finally going to go get this checked by his primary provider.  He is not having any further bleeding from his gums.  He does have some chronic dyspnea which has been related to emphysema in the past.  He denies any change in this.  Is not describing any palpitations, presyncope or syncope.  He said no PND or orthopnea.   No Known Allergies  Current Outpatient Medications  Medication Sig Dispense Refill  . metoprolol succinate (TOPROL-XL) 25 MG 24 hr tablet TAKE 1 TABLET BY MOUTH ONCE DAILY 90 tablet 0  . mirtazapine (REMERON) 15 MG tablet Take 1 tablet (15 mg total) by mouth at bedtime. 90 tablet 3  . XARELTO 20 MG TABS tablet TAKE 1 TABLET BY MOUTH DAILY WITH SUPPER 30 tablet 0   No current facility-administered medications for this visit.     Past Medical History:  Diagnosis Date  . Anxiety   . Arthritis    "mainly in my back; shoulders, ankles" (02/03/2014)  . Atrial fibrillation (HCC)    new onset 02/03/2014; CHADS2-VASC=2 (Xarelto started)  . Chronic back pain    "mainly small of my back; got degenerative disc there" (02/03/2014)  . Depression 1970's  . Emphysema of lung (HCC)   . Hx of cardiovascular stress test    a. Lexiscan Myoview (02/2014): No ischemia; EF 64%; normal study.  Marland Kitchen. Hx of echocardiogram    a. Echo (02/04/14):  EF 60-65%, no RWMA, mild RAE, PASP 35.  . Walking pneumonia 1970's    Past Surgical History:    Procedure Laterality Date  . INGUINAL HERNIA REPAIR Left 2007    ROS:  As stated in the HPI and negative for all other systems.  PHYSICAL EXAM BP 109/76   Pulse 85   Ht 6' (1.829 m)   Wt 215 lb (97.5 kg)   BMI 29.16 kg/m   GENERAL:  Well appearing NECK:  No jugular venous distention, waveform within normal limits, carotid upstroke brisk and symmetric, no bruits, no thyromegaly LUNGS:  Clear to auscultation bilaterally CHEST:  Unremarkable HEART:  PMI not displaced or sustained,S1 and S2 within normal limits, no S3, no clicks, no rubs, no murmurs, irregular  ABD:  Flat, positive bowel sounds normal in frequency in pitch, no bruits, no rebound, no guarding, no midline pulsatile mass, no hepatomegaly, no splenomegaly EXT:  2 plus pulses throughout, no edema, no cyanosis no clubbing   EKG:  Normal sinus rhythm, rate 85, premature atrial contractions, axis within normal limits, intervals within normal limits, no acute ST-T wave changes.  PACs 12/09/2017  ASSESSMENT AND PLAN   ATRIAL FIB:  CHADS2-VASc=2 (HTN, age > 4065).  We discussed again the need to take his Xarelto daily and that so I will prescribe it.  CHEST PAIN:   Patient has had no change in his pain since his previous negative stress  test.  This sounds like it could be GI and hopefully this year he will actually get it checked out.   NONCOMPLIANCE:    He is at high risk for poor health outcomes.  He and I have had this discussion.

## 2017-12-09 ENCOUNTER — Other Ambulatory Visit: Payer: Self-pay | Admitting: Cardiology

## 2017-12-09 ENCOUNTER — Ambulatory Visit: Payer: Medicare Other | Admitting: Cardiology

## 2017-12-09 ENCOUNTER — Encounter: Payer: Self-pay | Admitting: Cardiology

## 2017-12-09 VITALS — BP 109/76 | HR 85 | Ht 72.0 in | Wt 215.0 lb

## 2017-12-09 DIAGNOSIS — I480 Paroxysmal atrial fibrillation: Secondary | ICD-10-CM | POA: Diagnosis not present

## 2017-12-09 DIAGNOSIS — I10 Essential (primary) hypertension: Secondary | ICD-10-CM

## 2017-12-09 NOTE — Patient Instructions (Signed)
Medication Instructions:  The current medical regimen is effective;  continue present plan and medications.  Follow-Up: Follow up in 1 year with Dr. Hochrein in [place].  You will receive a letter in the mail 2 months before you are due.  Please call us when you receive this letter to schedule your follow up appointment.  If you need a refill on your cardiac medications before your next appointment, please call your pharmacy.  Thank you for choosing [facility]!!     

## 2017-12-24 ENCOUNTER — Other Ambulatory Visit: Payer: Self-pay | Admitting: Cardiology

## 2018-02-04 ENCOUNTER — Ambulatory Visit: Payer: Medicare Other | Admitting: Family Medicine

## 2018-02-10 ENCOUNTER — Encounter: Payer: Self-pay | Admitting: Family Medicine

## 2018-02-10 ENCOUNTER — Ambulatory Visit (INDEPENDENT_AMBULATORY_CARE_PROVIDER_SITE_OTHER): Payer: Medicare Other | Admitting: Family Medicine

## 2018-02-10 VITALS — BP 106/69 | HR 104 | Temp 96.5°F | Ht 72.0 in | Wt 215.4 lb

## 2018-02-10 DIAGNOSIS — F419 Anxiety disorder, unspecified: Secondary | ICD-10-CM

## 2018-02-10 DIAGNOSIS — K219 Gastro-esophageal reflux disease without esophagitis: Secondary | ICD-10-CM

## 2018-02-10 DIAGNOSIS — R1310 Dysphagia, unspecified: Secondary | ICD-10-CM | POA: Diagnosis not present

## 2018-02-10 MED ORDER — PANTOPRAZOLE SODIUM 40 MG PO TBEC: 40.0000 mg | DELAYED_RELEASE_TABLET | Freq: Every day | ORAL | 3 refills | Status: DC

## 2018-02-10 NOTE — Progress Notes (Signed)
   HPI  Patient presents today here for follow-up.  Patient continues to complain of severe GERD whenever he lays down at night.  He is willing to try Protonix now. He states that he has a history of hiatal hernia.  Patient also continues to complain of central chest pain whenever he tries to swallow anything at all.  PMH: Smoking status noted ROS: Per HPI  Objective: BP 106/69   Pulse (!) 104   Temp (!) 96.5 F (35.8 C) (Oral)   Ht 6' (1.829 m)   Wt 215 lb 6.4 oz (97.7 kg)   BMI 29.21 kg/m  Gen: NAD, alert, cooperative with exam HEENT: NCAT CV: RRR, good S1/S2, no murmur Resp: CTABL, no wheezes, non-labored Neuro: Alert and oriented, No gross deficits  Assessment and plan:  #GERD With history of hiatal hernia Start PPI   #Pain with swallowing Some concern for esophagitis, esophageal rings, or esophageal dyskinesia Refer to GI, appreciate the recommendations and workup  #Anxiety Patient using Remeron 1/2 pill most days, he states that he does not take it all the time, he is concerned about difficulty stopping the medication that he had with previous medications. Feels well controlled, no changes      Orders Placed This Encounter  Procedures  . Ambulatory referral to Gastroenterology    Referral Priority:   Routine    Referral Type:   Consultation    Referral Reason:   Specialty Services Required    Number of Visits Requested:   1    Meds ordered this encounter  Medications  . pantoprazole (PROTONIX) 40 MG tablet    Sig: Take 1 tablet (40 mg total) by mouth daily.    Dispense:  30 tablet    Refill:  3    Murtis SinkSam Bradshaw, MD [facility] 02/10/2018, 11:00 AM

## 2018-02-10 NOTE — Patient Instructions (Signed)
Great to see you!  Start pantoprazole 1 pill once daily for GERD ( severe heartburn), we will work on a referral to GI to help evaluate your pan with swallowing.

## 2018-02-22 ENCOUNTER — Encounter: Payer: Self-pay | Admitting: *Deleted

## 2018-02-22 ENCOUNTER — Ambulatory Visit (INDEPENDENT_AMBULATORY_CARE_PROVIDER_SITE_OTHER): Payer: Medicare Other | Admitting: *Deleted

## 2018-02-22 VITALS — BP 121/79 | HR 101 | Ht 70.5 in | Wt 212.0 lb

## 2018-02-22 DIAGNOSIS — Z0000 Encounter for general adult medical examination without abnormal findings: Secondary | ICD-10-CM

## 2018-02-22 NOTE — Progress Notes (Addendum)
Subjective:   Kenneth Murphy is a 72 y.o. male who presents for an Initial Medicare Annual Wellness Visit. Kenneth Murphy is not married and does not have children. His sister moved in with him about a year ago after her husband died. He is retired but works part time at the [employer]. He typically works every other week.   Review of Systems  Health is about the same as last year.   Cardiac Risk Factors include: family history of premature cardiovascular disease;sedentary lifestyle;smoking/ tobacco exposure;hypertension;male gender  Other systems negative.    Objective:    Today's Vitals   02/22/18 1009  BP: 121/79  Pulse: (!) 101  Weight: 212 lb (96.2 kg)  Height: 5' 10.5" (1.791 m)   Body mass index is 29.99 kg/m.  Advanced Directives 02/22/2018 02/03/2014  Does Patient Have a Medical Advance Directive? No Patient does not have advance directive;Patient would not like information  Would patient like information on creating a medical advance directive? No - Patient declined -    Current Medications (verified) Outpatient Encounter Medications as of 02/22/2018  Medication Sig  . metoprolol succinate (TOPROL-XL) 25 MG 24 hr tablet TAKE 1 TABLET BY MOUTH ONCE DAILY  . mirtazapine (REMERON) 15 MG tablet Take 1 tablet (15 mg total) by mouth at bedtime.  . pantoprazole (PROTONIX) 40 MG tablet Take 1 tablet (40 mg total) by mouth daily.  [name] 20 MG TABS tablet TAKE 1 TABLET BY MOUTH DAILY WITH SUPPER   No facility-administered encounter medications on file as of 02/22/2018.     Allergies (verified) Patient has no known allergies.   History: Past Medical History:  Diagnosis Date  . Anxiety   . Arthritis    "mainly in my back; shoulders, ankles" (02/03/2014)  . Atrial fibrillation (HCC)    new onset 02/03/2014; CHADS2-VASC=2 (Xarelto started)  . Chronic back pain    "mainly small of my back; got degenerative disc there" (02/03/2014)  . Depression 1970's  . Emphysema  of lung (HCC)   . Hx of cardiovascular stress test    a. Lexiscan Myoview (02/2014): No ischemia; EF 64%; normal study.  Marland Kitchen Hx of echocardiogram    a. Echo (02/04/14):  EF 60-65%, no RWMA, mild RAE, PASP 35.  . Walking pneumonia 1970's   Past Surgical History:  Procedure Laterality Date  . INGUINAL HERNIA REPAIR Left 2007   Family History  Problem Relation Age of Onset  . Atrial fibrillation Unknown   . Heart attack Father   . Gallstones Father   . Parkinson's disease Mother 39  . Heart attack Sister   . Alcohol abuse Brother   . COPD Brother    Social History   Socioeconomic History  . Marital status: Single    Spouse name: Not on file  . Number of children: 0  . Years of education: 41  . Highest education level: 12th grade  Social Needs  . Financial resource strain: Not hard at all  . Food insecurity - worry: Never true  . Food insecurity - inability: Never true  . Transportation needs - medical: No  . Transportation needs - non-medical: No  Occupational History  . Occupation: [employer]    Comment: Part Time   Tobacco Use  . Smoking status: Former Smoker    Packs/day: 1.00    Years: 55.00    Pack years: 55.00    Types: Cigarettes    Last attempt to quit: 01/31/2014  Years since quitting: 4.0  . Smokeless tobacco: Never Used  . Tobacco comment: Former smoker-no current use  Substance and Sexual Activity  . Alcohol use: No    Comment: 02/03/2014 "usually drink 9-10 beers couple days/wk; haven't had any since this past Tuesday"  . Drug use: No  . Sexual activity: No  Other Topics Concern  . Not on file  Social History Narrative   Works part time [employer]   Sister lives with him    No children and is not married   Tobacco Counseling Counseling given: Not Answered Comment: Former smoker-no current use   Clinical Intake:  Pre-visit preparation completed: No  Pain : No/denies pain     Nutritional Status: BMI 25 -29  Overweight Diabetes: No  How often do you need to have someone help you when you read instructions, pamphlets, or other written materials from your doctor or pharmacy?: 1 - Never What is the last grade level you completed in school?: 12th  Interpreter Needed?: No  Information entered by :: Demetrios LollKristen Corayma Cashatt, RN  Activities of Daily Living In your present state of health, do you have any difficulty performing the following activities: 02/22/2018  Hearing? N  Comment tinnitus  Vision? Y  Comment wears glasses to drive. Exam is overdue.   Difficulty concentrating or making decisions? Y  Comment Has some difficulty remembering names at times  Walking or climbing stairs? N  Comment No steps at home  Dressing or bathing? N  Doing errands, shopping? N  Preparing Food and eating ? N  Using the Toilet? N  In the past six months, have you accidently leaked urine? N  Do you have problems with loss of bowel control? N  Managing your Medications? N  Comment Keeps them organized  Managing your Finances? N  Housekeeping or managing your Housekeeping? N  Some recent data might be hidden     Immunizations and Health Maintenance  There is no immunization history on file for this patient. Health Maintenance Due  Topic Date Due  . Hepatitis C Screening  1946-07-01    Patient Care Team: Elenora GammaBradshaw, Samuel L, MD as PCP - General (Family Medicine)   No hospitalizations, ER visits, or surgeries this past year.   Assessment:   This is a routine wellness examination for Kenneth NajjarLarry.  Hearing/Vision screen Wears glasses when driving. Eye exam is overdue.   Dietary issues and exercise activities discussed: Current Exercise Habits: The patient does not participate in regular exercise at present(Works 5 days every other week and is physically active those days), Exercise limited by: None identified  Goals    . Exercise 150 min/wk Moderate Activity      Depression Screen PHQ 2/9 Scores 02/22/2018  02/10/2018 10/21/2017 09/11/2017  PHQ - 2 Score 0 0 0 3  PHQ- 9 Score - - - 5    Fall Risk Fall Risk  02/22/2018 02/10/2018 10/21/2017 09/11/2017  Falls in the past year? No No No No    Is the patient's home free of loose throw rugs in walkways, pet beds, electrical cords, etc?   yes      Grab bars in the bathroom? no      Handrails on the stairs?   yes      Adequate lighting?   yes    Cognitive Function: MMSE - Mini Mental State Exam 02/22/2018 02/22/2018  Orientation to time 5 5  Orientation to Place 5 5  Registration 3 -  Attention/ Calculation 5 -  Recall 3 -  Language- name 2 objects 2 -  Language- repeat 1 -  Language- follow 3 step command 3 -  Language- read & follow direction 1 -  Copy design 0 -  Normal exam      Screening Tests Health Maintenance  Topic Date Due  . Hepatitis C Screening  12-18-1946  . INFLUENZA VACCINE  03/21/2018 (Originally 07/22/2017)  . PNA vac Low Risk Adult (1 of 2 - PCV13) 02/10/2019 (Originally 09/26/2011)  . COLONOSCOPY  02/23/2019 (Originally 09/25/1996)  . TETANUS/TDAP  02/23/2019 (Originally 09/25/1965)    Qualifies for Shingles Vaccine? Yes, declined  Cancer Screenings: Lung: Low Dose CT Chest recommended if Age 39-80 years, 30 pack-year currently smoking OR have quit w/in 15years. Patient does qualify. Colorectal: Patient declined  Additional Screenings:  Hepatitis C Screening: will perform with next labs      Plan:   Schedule eye exam with Dr Conley Rolls Increase activity level. Aim for 150 minutes a week Consider cologuard or other colon cancer screening Consider Tdap and prevnar  I have personally reviewed and noted the following in the patient's chart:   . Medical and social history . Use of alcohol, tobacco or illicit drugs  . Current medications and supplements . Functional ability and status . Nutritional status . Physical activity . Advanced directives . List of other physicians . Hospitalizations, surgeries, and ER visits  in previous 12 months . Vitals . Screenings to include cognitive, depression, and falls . Referrals and appointments  In addition, I have reviewed and discussed with patient certain preventive protocols, quality metrics, and best practice recommendations. A written personalized care plan for preventive services as well as general preventive health recommendations were provided to patient.     Demetrios Loll, RN  02/22/2018    I have reviewed and agree with the above AWV documentation.   Murtis Sink, MD [facility] 02/22/2018, 4:34 PM

## 2018-02-22 NOTE — Patient Instructions (Addendum)
  [name] , Thank you for taking time to come for your Medicare Wellness Visit. I appreciate your ongoing commitment to your health goals. Please review the following plan we discussed and let me know if I can assist you in the future.   These are the goals we discussed: Goals    . Exercise 150 min/wk Moderate Activity       This is a list of the screening recommended for you and due dates:  Health Maintenance  Topic Date Due  .  Hepatitis C: One time screening is recommended by Center for Disease Control  (CDC) for  adults born from [birth year] through 1965.   Oct 16, 1946  . Flu Shot  03/21/2018*  . Pneumonia vaccines (1 of 2 - PCV13) 02/10/2019*  . Colon Cancer Screening  02/23/2019*  . Tetanus Vaccine  02/23/2019*  *Topic was postponed. The date shown is not the original due date.   Schedule eye exam with Dr Conley RollsLe Consider a colon cancer screening like Cologuard

## 2018-03-01 ENCOUNTER — Ambulatory Visit (INDEPENDENT_AMBULATORY_CARE_PROVIDER_SITE_OTHER): Payer: Medicare Other | Admitting: Internal Medicine

## 2018-03-22 ENCOUNTER — Encounter (INDEPENDENT_AMBULATORY_CARE_PROVIDER_SITE_OTHER): Payer: Self-pay | Admitting: *Deleted

## 2018-03-22 ENCOUNTER — Ambulatory Visit (INDEPENDENT_AMBULATORY_CARE_PROVIDER_SITE_OTHER): Payer: Medicare Other | Admitting: Internal Medicine

## 2018-03-22 ENCOUNTER — Encounter (INDEPENDENT_AMBULATORY_CARE_PROVIDER_SITE_OTHER): Payer: Self-pay | Admitting: Internal Medicine

## 2018-03-22 ENCOUNTER — Telehealth: Payer: Self-pay | Admitting: Cardiology

## 2018-03-22 VITALS — BP 128/70 | HR 72 | Temp 97.5°F | Ht 72.0 in | Wt 215.2 lb

## 2018-03-22 DIAGNOSIS — K219 Gastro-esophageal reflux disease without esophagitis: Secondary | ICD-10-CM | POA: Diagnosis not present

## 2018-03-22 DIAGNOSIS — R1319 Other dysphagia: Secondary | ICD-10-CM

## 2018-03-22 DIAGNOSIS — R1310 Dysphagia, unspecified: Secondary | ICD-10-CM | POA: Insufficient documentation

## 2018-03-22 NOTE — Telephone Encounter (Signed)
° °  [facility] Pre-operative Risk Assessment    Request for surgical clearance:  1. What type of surgery is being performed? Endoscopy with Dilation   2. When is this surgery scheduled? 04-29-18   3. What type of clearance is required (medical clearance vs. Pharmacy clearance to hold med vs. Both)? Pharmacy  4. Are there any medications that need to be held prior to surgery and how long?Can pt stop his Xarelto 2 days prior to procedure 5. Practice name and name of physician performing surgery? Dr  Hildred Laser   6. What is your office phone and fax number? Please send via EPIC   7. Anesthesia type (None, local, MAC, general) ? General   Glyn Ade 03/22/2018, 10:27 AM  _________________________________________________________________   (provider comments below)

## 2018-03-22 NOTE — Progress Notes (Signed)
   Subjective:    Patient ID: Kenneth Murphy, male    DOB: 03/31/1946, 72 y.o.   MRN: [id number]  HPI Referred by Dr. Ermalinda MemosBradshaw for painful swallowing. Per records, has hx of hiatal hernia.  Has chest pain when he swallows anything. He was given an Rx for Protonix which has helped. He says his symptom(burning) are worse when he lies down. He says foods are slow to go down.  He has had symptoms for over a year. Biscuits bother him. His appetite is good. No weight loss. He has a BM 2-3 times a day. No change in his stools.   Hx of paroxysmal atrial fib. and maintained on Xarelto.   Review of Systems Past Medical History:  Diagnosis Date  . Anxiety   . Arthritis    "mainly in my back; shoulders, ankles" (02/03/2014)  . Atrial fibrillation (HCC)    new onset 02/03/2014; CHADS2-VASC=2 (Xarelto started)  . Chronic back pain    "mainly small of my back; got degenerative disc there" (02/03/2014)  . Depression 1970's  . Emphysema of lung (HCC)   . Hx of cardiovascular stress test    a. Lexiscan Myoview (02/2014): No ischemia; EF 64%; normal study.  Marland Kitchen. Hx of echocardiogram    a. Echo (02/04/14):  EF 60-65%, no RWMA, mild RAE, PASP 35.  . Walking pneumonia 1970's    Past Surgical History:  Procedure Laterality Date  . INGUINAL HERNIA REPAIR Left 2007    No Known Allergies  Current Outpatient Medications on File Prior to Visit  Medication Sig Dispense Refill  . metoprolol succinate (TOPROL-XL) 25 MG 24 hr tablet TAKE 1 TABLET BY MOUTH ONCE DAILY 90 tablet 3  . mirtazapine (REMERON) 15 MG tablet Take 1 tablet (15 mg total) by mouth at bedtime. (Patient taking differently: Take 15 mg by mouth as needed. ) 90 tablet 3  . pantoprazole (PROTONIX) 40 MG tablet Take 1 tablet (40 mg total) by mouth daily. 30 tablet 3  . XARELTO 20 MG TABS tablet TAKE 1 TABLET BY MOUTH DAILY WITH SUPPER 30 tablet 3   No current facility-administered medications on file prior to visit.         Objective:   Physical  Exam Blood pressure 128/70, pulse 72, temperature (!) 97.5 F (36.4 C), height 6' (1.829 m), weight 215 lb 3.2 oz (97.6 kg). Alert and oriented. Skin warm and dry. Oral mucosa is moist.   . Sclera anicteric, conjunctivae is pink. Thyroid not enlarged. No cervical lymphadenopathy. Lungs clear. Heart regular rate and rhythm.  Abdomen is soft. Bowel sounds are positive. No hepatomegaly. No abdominal masses felt. No tenderness.  No edema to lower extremities.           Assessment & Plan:  GERD. Continue the Protonix. Raise head of bed up. EGD/ed. The risks of bleeding, perforation and infection were reviewed with patient.  Zantac at hs. GERD diet given to patient

## 2018-03-22 NOTE — Patient Instructions (Addendum)
Gastroesophageal Reflux Disease, Adult Normally, food travels down the esophagus and stays in the stomach to be digested. However, when a person has gastroesophageal reflux disease (GERD), food and stomach acid move back up into the esophagus. When this happens, the esophagus becomes sore and inflamed. Over time, GERD can create small holes (ulcers) in the lining of the esophagus. What are the causes? This condition is caused by a problem with the muscle between the esophagus and the stomach (lower esophageal sphincter, or LES). Normally, the LES muscle closes after food passes through the esophagus to the stomach. When the LES is weakened or abnormal, it does not close properly, and that allows food and stomach acid to go back up into the esophagus. The LES can be weakened by certain dietary substances, medicines, and medical conditions, including:  Tobacco use.  Pregnancy.  Having a hiatal hernia.  Heavy alcohol use.  Certain foods and beverages, such as coffee, chocolate, onions, and peppermint.  What increases the risk? This condition is more likely to develop in:  People who have an increased body weight.  People who have connective tissue disorders.  People who use NSAID medicines.  What are the signs or symptoms? Symptoms of this condition include:  Heartburn.  Difficult or painful swallowing.  The feeling of having a lump in the throat.  Abitter taste in the mouth.  Bad breath.  Having a large amount of saliva.  Having an upset or bloated stomach.  Belching.  Chest pain.  Shortness of breath or wheezing.  Ongoing (chronic) cough or a night-time cough.  Wearing away of tooth enamel.  Weight loss.  Different conditions can cause chest pain. Make sure to see your health care provider if you experience chest pain. How is this diagnosed? Your health care provider will take a medical history and perform a physical exam. To determine if you have mild or severe  GERD, your health care provider may also monitor how you respond to treatment. You may also have other tests, including:  An endoscopy toexamine your stomach and esophagus with a small camera.  A test thatmeasures the acidity level in your esophagus.  A test thatmeasures how much pressure is on your esophagus.  A barium swallow or modified barium swallow to show the shape, size, and functioning of your esophagus.  How is this treated? The goal of treatment is to help relieve your symptoms and to prevent complications. Treatment for this condition may vary depending on how severe your symptoms are. Your health care provider may recommend:  Changes to your diet.  Medicine.  Surgery.  Follow these instructions at home: Diet  Follow a diet as recommended by your health care provider. This may involve avoiding foods and drinks such as: ? Coffee and tea (with or without caffeine). ? Drinks that containalcohol. ? Energy drinks and sports drinks. ? Carbonated drinks or sodas. ? Chocolate and cocoa. ? Peppermint and mint flavorings. ? Garlic and onions. ? Horseradish. ? Spicy and acidic foods, including peppers, chili powder, curry powder, vinegar, hot sauces, and barbecue sauce. ? Citrus fruit juices and citrus fruits, such as oranges, lemons, and limes. ? Tomato-based foods, such as red sauce, chili, salsa, and pizza with red sauce. ? Fried and fatty foods, such as donuts, french fries, potato chips, and high-fat dressings. ? High-fat meats, such as hot dogs and fatty cuts of red and white meats, such as rib eye steak, sausage, ham, and bacon. ? High-fat dairy items, such as whole milk,   butter, and cream cheese.  Eat small, frequent meals instead of large meals.  Avoid drinking large amounts of liquid with your meals.  Avoid eating meals during the 2-3 hours before bedtime.  Avoid lying down right after you eat.  Do not exercise right after you eat. General  instructions  Pay attention to any changes in your symptoms.  Take over-the-counter and prescription medicines only as told by your health care provider. Do not take aspirin, ibuprofen, or other NSAIDs unless your health care provider told you to do so.  Do not use any tobacco products, including cigarettes, chewing tobacco, and e-cigarettes. If you need help quitting, ask your health care provider.  Wear loose-fitting clothing. Do not wear anything tight around your waist that causes pressure on your abdomen.  Raise (elevate) the head of your bed 6 inches (15cm).  Try to reduce your stress, such as with yoga or meditation. If you need help reducing stress, ask your health care provider.  If you are overweight, reduce your weight to an amount that is healthy for you. Ask your health care provider for guidance about a safe weight loss goal.  Keep all follow-up visits as told by your health care provider. This is important. Contact a health care provider if:  You have new symptoms.  You have unexplained weight loss.  You have difficulty swallowing, or it hurts to swallow.  You have wheezing or a persistent cough.  Your symptoms do not improve with treatment.  You have a hoarse voice. Get help right away if:  You have pain in your arms, neck, jaw, teeth, or back.  You feel sweaty, dizzy, or light-headed.  You have chest pain or shortness of breath.  You vomit and your vomit looks like blood or coffee grounds.  You faint.  Your stool is bloody or black.  You cannot swallow, drink, or eat. This information is not intended to replace advice given to you by your health care provider. Make sure you discuss any questions you have with your health care provider. Document Released: 09/17/2005 Document Revised: 05/07/2016 Document Reviewed: 04/04/2015 Elsevier Interactive Patient Education  [employer]2018 Else[employer] Inc.  The risks of bleeding, perforation and infection were reviewed with  patient. Take a Zantac at night before u go to bed.

## 2018-03-24 ENCOUNTER — Encounter (INDEPENDENT_AMBULATORY_CARE_PROVIDER_SITE_OTHER): Payer: Self-pay | Admitting: *Deleted

## 2018-03-25 NOTE — Telephone Encounter (Signed)
Patient with diagnosis of atrial fibrillation on Xarelto for anticoagulation.    Procedure: endoscopy with dilation Date of procedure: 04-29-18  CHADS2-VASc score of  2 (, HTN, AGE,   CrCl 82.1 Platelet count 196  Per office protocol, patient can hold Xarelto for 3 days prior to procedure.    Patient should restart Xarelto on the evening of procedure or day after, at discretion of procedure MD

## 2018-03-29 ENCOUNTER — Telehealth (INDEPENDENT_AMBULATORY_CARE_PROVIDER_SITE_OTHER): Payer: Self-pay | Admitting: Internal Medicine

## 2018-03-29 NOTE — Telephone Encounter (Signed)
Patients contact person came by office stated she is hard of hearing and spoke with someone by phone last week - she has questions about patients procedure-the letter she had showed the date as being 04-29-2018 - computer shows a date in June - phone# [phone] please call

## 2018-03-30 NOTE — Telephone Encounter (Signed)
On 4/2 at 4:10 PM spoke with wife and he needed to have procedure changed because he has to work--they had wrong dates of his weeks off.  We rescheduled for 6/12.

## 2018-03-30 NOTE — Telephone Encounter (Signed)
Spoke with his wife--mail came yesterday and she got new instructions

## 2018-04-04 NOTE — Telephone Encounter (Signed)
Cardiology recommendations appreciated regarding Xarelto interruption for colonoscopy.

## 2018-04-06 ENCOUNTER — Encounter (INDEPENDENT_AMBULATORY_CARE_PROVIDER_SITE_OTHER): Payer: Self-pay | Admitting: *Deleted

## 2018-04-23 ENCOUNTER — Other Ambulatory Visit: Payer: Self-pay | Admitting: Cardiology

## 2018-05-31 ENCOUNTER — Telehealth (INDEPENDENT_AMBULATORY_CARE_PROVIDER_SITE_OTHER): Payer: Self-pay | Admitting: *Deleted

## 2018-05-31 NOTE — Telephone Encounter (Signed)
noted 

## 2018-05-31 NOTE — Telephone Encounter (Signed)
Patient left patient to cancel procedure

## 2018-06-02 ENCOUNTER — Ambulatory Visit (HOSPITAL_COMMUNITY): Admit: 2018-06-02 | Payer: Medicare Other | Admitting: Internal Medicine

## 2018-06-02 ENCOUNTER — Encounter (HOSPITAL_COMMUNITY): Payer: Self-pay

## 2018-06-02 SURGERY — EGD (ESOPHAGOGASTRODUODENOSCOPY)
Anesthesia: Moderate Sedation

## 2018-06-03 ENCOUNTER — Ambulatory Visit: Payer: Medicare Other | Admitting: Family Medicine

## 2018-06-04 ENCOUNTER — Encounter: Payer: Self-pay | Admitting: Family Medicine

## 2018-06-04 ENCOUNTER — Ambulatory Visit (INDEPENDENT_AMBULATORY_CARE_PROVIDER_SITE_OTHER): Payer: Medicare Other | Admitting: Family Medicine

## 2018-06-04 VITALS — BP 101/73 | HR 85 | Temp 96.8°F | Ht 72.0 in | Wt 217.0 lb

## 2018-06-04 DIAGNOSIS — I480 Paroxysmal atrial fibrillation: Secondary | ICD-10-CM

## 2018-06-04 DIAGNOSIS — K219 Gastro-esophageal reflux disease without esophagitis: Secondary | ICD-10-CM | POA: Diagnosis not present

## 2018-06-04 DIAGNOSIS — I10 Essential (primary) hypertension: Secondary | ICD-10-CM

## 2018-06-04 DIAGNOSIS — M7989 Other specified soft tissue disorders: Secondary | ICD-10-CM

## 2018-06-04 NOTE — Patient Instructions (Signed)
Great to see you!  Come back in 3-4 months to see Dr. Louanne Skyeettinger

## 2018-06-04 NOTE — Progress Notes (Signed)
   HPI  Patient presents today here for follow-up chronic medical conditions.  GERD Doing well with PPI  Patient states over the last few days he has noticed more persistent ankle edema.  He states it usually comes on for a couple of days and then gets better, however now is been present for 3 to 4 days at a time. Denies any shortness of breath or chest pain, orthopnea He denies any increased salt intake, however discusses that he eats lots of fast food after we discussed this.  Hypertension Good medication compliance.  A. fib No racing heart, good medication compliance, no bleeding   PMH: Smoking status noted ROS: Per HPI  Objective: BP 101/73 (BP Location: Left Arm)   Pulse 85   Temp (!) 96.8 F (36 C) (Oral)   Ht 6' (1.829 m)   Wt 217 lb (98.4 kg)   BMI 29.43 kg/m  Gen: NAD, alert, cooperative with exam HEENT: NCAT CV: RRR, good S1/S2, no murmur Resp: CTABL, no wheezes, non-labored Ext: No edema, warm Neuro: Alert and oriented, No gross deficits  Assessment and plan:  #A. fib Rate controlled, on Xarelto Labs today  #Hypertension Well-controlled No changes Labs today  #GERD Well-controlled on PPI, continue  #Leg swelling Likely simple venous stasis, no signs of CHF Reassurance provided,    Orders Placed This Encounter  Procedures  . CMP14+EGFR  . CBC with Differential/Platelet  . Lipid panel  . Thyroid Panel With TSH    [name], MD [facility] 06/04/2018, 9:54 AM

## 2018-06-05 LAB — CMP14+EGFR
A/G RATIO: 1.9 (ref 1.2–2.2)
ALK PHOS: 63 IU/L (ref 39–117)
ALT: 19 IU/L (ref 0–44)
AST: 22 IU/L (ref 0–40)
Albumin: 4.3 g/dL (ref 3.5–4.8)
BILIRUBIN TOTAL: 0.5 mg/dL (ref 0.0–1.2)
BUN/Creatinine Ratio: 12 (ref 10–24)
BUN: 15 mg/dL (ref 8–27)
CHLORIDE: 105 mmol/L (ref 96–106)
CO2: 22 mmol/L (ref 20–29)
Calcium: 9.7 mg/dL (ref 8.6–10.2)
Creatinine, Ser: 1.22 mg/dL (ref 0.76–1.27)
GFR calc Af Amer: 69 mL/min/{1.73_m2} (ref >59)
GFR calc non Af Amer: 59 mL/min/{1.73_m2} — ABNORMAL LOW (ref >59)
GLOBULIN, TOTAL: 2.3 g/dL (ref 1.5–4.5)
GLUCOSE: 95 mg/dL (ref 65–99)
POTASSIUM: 4.8 mmol/L (ref 3.5–5.2)
SODIUM: 143 mmol/L (ref 134–144)
TOTAL PROTEIN: 6.6 g/dL (ref 6.0–8.5)

## 2018-06-05 LAB — LIPID PANEL
CHOLESTEROL TOTAL: 191 mg/dL (ref 100–199)
Chol/HDL Ratio: 5.6 ratio — ABNORMAL HIGH (ref 0.0–5.0)
HDL: 34 mg/dL — AB (ref >39)
LDL Calculated: 126 mg/dL — ABNORMAL HIGH (ref 0–99)
Triglycerides: 156 mg/dL — ABNORMAL HIGH (ref 0–149)
VLDL Cholesterol Cal: 31 mg/dL (ref 5–40)

## 2018-06-05 LAB — CBC WITH DIFFERENTIAL/PLATELET
BASOS ABS: 0 10*3/uL (ref 0.0–0.2)
BASOS: 0 %
EOS (ABSOLUTE): 0.1 10*3/uL (ref 0.0–0.4)
Eos: 2 %
Hematocrit: 47.9 % (ref 37.5–51.0)
Hemoglobin: 16.2 g/dL (ref 13.0–17.7)
IMMATURE GRANS (ABS): 0 10*3/uL (ref 0.0–0.1)
IMMATURE GRANULOCYTES: 0 %
LYMPHS: 36 %
Lymphocytes Absolute: 2.7 10*3/uL (ref 0.7–3.1)
MCH: 30.6 pg (ref 26.6–33.0)
MCHC: 33.8 g/dL (ref 31.5–35.7)
MCV: 90 fL (ref 79–97)
MONOS ABS: 0.7 10*3/uL (ref 0.1–0.9)
Monocytes: 9 %
NEUTROS PCT: 53 %
Neutrophils Absolute: 3.9 10*3/uL (ref 1.4–7.0)
Platelets: 184 10*3/uL (ref 150–450)
RBC: 5.3 x10E6/uL (ref 4.14–5.80)
RDW: 14.8 % (ref 12.3–15.4)
WBC: 7.4 10*3/uL (ref 3.4–10.8)

## 2018-06-05 LAB — THYROID PANEL WITH TSH
Free Thyroxine Index: 1.6 (ref 1.2–4.9)
T3 UPTAKE RATIO: 24 % (ref 24–39)
T4, Total: 6.5 ug/dL (ref 4.5–12.0)
TSH: 2.1 u[IU]/mL (ref 0.450–4.500)

## 2018-07-12 ENCOUNTER — Other Ambulatory Visit: Payer: Self-pay | Admitting: Family Medicine

## 2018-09-15 ENCOUNTER — Ambulatory Visit: Payer: Medicare Other | Admitting: Family Medicine

## 2018-09-22 ENCOUNTER — Encounter: Payer: Self-pay | Admitting: Family Medicine

## 2018-09-22 ENCOUNTER — Ambulatory Visit (INDEPENDENT_AMBULATORY_CARE_PROVIDER_SITE_OTHER): Payer: Medicare Other | Admitting: Family Medicine

## 2018-09-22 VITALS — BP 125/90 | HR 93 | Temp 96.9°F | Ht 72.0 in | Wt 218.2 lb

## 2018-09-22 DIAGNOSIS — I10 Essential (primary) hypertension: Secondary | ICD-10-CM

## 2018-09-22 DIAGNOSIS — K219 Gastro-esophageal reflux disease without esophagitis: Secondary | ICD-10-CM

## 2018-09-22 DIAGNOSIS — I480 Paroxysmal atrial fibrillation: Secondary | ICD-10-CM

## 2018-09-22 MED ORDER — PANTOPRAZOLE SODIUM 40 MG PO TBEC: 40.0000 mg | DELAYED_RELEASE_TABLET | Freq: Every day | ORAL | 3 refills | Status: AC

## 2018-09-22 MED ORDER — RIVAROXABAN 20 MG PO TABS: ORAL_TABLET | ORAL | 3 refills | Status: AC

## 2018-09-22 MED ORDER — METOPROLOL SUCCINATE ER 25 MG PO TB24: 25.0000 mg | ORAL_TABLET | Freq: Every day | ORAL | 3 refills | Status: AC

## 2018-09-22 NOTE — Progress Notes (Signed)
BP 125/90   Pulse 93   Temp (!) 96.9 F (36.1 C) (Oral)   Ht 6' (1.829 m)   Wt 218 lb 3.2 oz (99 kg)   BMI 29.59 kg/m    Subjective:    Patient ID: Kenneth Murphy, male    DOB: 1946/08/13, 72 y.o.   MRN: [id number]  HPI: Kenneth Murphy is a 72 y.o. male presenting on 09/22/2018 for Gastroesophageal Reflux (4 month follow up); Hypertension; and Establish Care Wendi Snipes pt)   HPI Hypertension Patient is currently on metoprolol, and their blood pressure today is 125/90 with a heart rate of 93. Patient denies any lightheadedness or dizziness. Patient denies headaches, blurred vision, chest pains, shortness of breath, or weakness. Denies any side effects from medication and is content with current medication.   GERD Patient is currently on pantoprazole.  She denies any major symptoms or abdominal pain or belching or burping. She denies any blood in her stool or lightheadedness or dizziness.   A. fib recheck Patient is coming in today for recheck on his paroxysmal A. fib.  He denies any palpitations no chest pain or pressure today.  He is currently taking Xarelto and is very satisfied with his Xarelto currently.  He denies any bruising or bleeding.  He does have a cardiologist and follows up with them for this.  Relevant past medical, surgical, family and social history reviewed and updated as indicated. Interim medical history since our last visit reviewed. Allergies and medications reviewed and updated.  Review of Systems  Constitutional: Negative for chills and fever.  Eyes: Negative for visual disturbance.  Respiratory: Negative for shortness of breath and wheezing.   Cardiovascular: Negative for chest pain, palpitations and leg swelling.  Musculoskeletal: Negative for back pain and gait problem.  Skin: Negative for rash.  Neurological: Negative for dizziness, weakness, light-headedness and numbness.  All other systems reviewed and are negative.   Per HPI unless specifically  indicated above   Allergies as of 09/22/2018   No Known Allergies     Medication List        Accurate as of 09/22/18  2:13 PM. Always use your most recent med list.          metoprolol succinate 25 MG 24 hr tablet Commonly known as:  TOPROL-XL Take 1 tablet (25 mg total) by mouth daily.   mirtazapine 15 MG tablet Commonly known as:  REMERON Take 1 tablet (15 mg total) by mouth at bedtime.   pantoprazole 40 MG tablet Commonly known as:  PROTONIX Take 1 tablet (40 mg total) by mouth daily.   rivaroxaban 20 MG Tabs tablet Commonly known as:  XARELTO TAKE 1 TABLET BY MOUTH DAILY WITH SUPPER          Objective:    BP 125/90   Pulse 93   Temp (!) 96.9 F (36.1 C) (Oral)   Ht 6' (1.829 m)   Wt 218 lb 3.2 oz (99 kg)   BMI 29.59 kg/m   Wt Readings from Last 3 Encounters:  09/22/18 218 lb 3.2 oz (99 kg)  06/04/18 217 lb (98.4 kg)  03/22/18 215 lb 3.2 oz (97.6 kg)    Physical Exam  Constitutional: He is oriented to person, place, and time. He appears well-developed and well-nourished. No distress.  Eyes: Conjunctivae are normal. No scleral icterus.  Neck: Neck supple. No thyromegaly present.  Cardiovascular: Normal rate, regular rhythm, normal heart sounds and intact distal pulses.  No murmur heard. Pulmonary/Chest: Effort  normal and breath sounds normal. No respiratory distress. He has no wheezes.  Musculoskeletal: Normal range of motion. He exhibits no edema.  Lymphadenopathy:    He has no cervical adenopathy.  Neurological: He is alert and oriented to person, place, and time. Coordination normal.  Skin: Skin is warm and dry. No rash noted. He is not diaphoretic.  Psychiatric: He has a normal mood and affect. His behavior is normal.  Nursing note and vitals reviewed.       Assessment & Plan:   Problem List Items Addressed This Visit      Cardiovascular and Mediastinum   A-fib ([place])   Relevant Medications   metoprolol succinate (TOPROL-XL) 25 MG 24 hr  tablet   rivaroxaban (XARELTO) 20 MG TABS tablet   Other Relevant Orders   Lipid panel   BMP8+EGFR   Hypertension - Primary   Relevant Medications   metoprolol succinate (TOPROL-XL) 25 MG 24 hr tablet   rivaroxaban (XARELTO) 20 MG TABS tablet   Other Relevant Orders   Lipid panel   BMP8+EGFR     Digestive   Gastroesophageal reflux disease without esophagitis   Relevant Medications   pantoprazole (PROTONIX) 40 MG tablet       Follow up plan: Return in about 3 months (around 12/23/2018), or if symptoms worsen or fail to improve, for Recheck hypertension GERD and A. fib.  Counseling provided for all of the vaccine components Orders Placed This Encounter  Procedures  . Lipid panel  . BMP8+EGFR    Caryl Pina, MD [facility] 09/22/2018, 2:13 PM

## 2018-09-23 ENCOUNTER — Telehealth: Payer: Self-pay | Admitting: Family Medicine

## 2018-09-23 LAB — BMP8+EGFR
BUN/Creatinine Ratio: 15 (ref 10–24)
BUN: 15 mg/dL (ref 8–27)
CO2: 23 mmol/L (ref 20–29)
CREATININE: 1.01 mg/dL (ref 0.76–1.27)
Calcium: 9.5 mg/dL (ref 8.6–10.2)
Chloride: 105 mmol/L (ref 96–106)
GFR calc Af Amer: 86 mL/min/{1.73_m2} (ref >59)
GFR, EST NON AFRICAN AMERICAN: 74 mL/min/{1.73_m2} (ref >59)
GLUCOSE: 110 mg/dL — AB (ref 65–99)
Potassium: 4.7 mmol/L (ref 3.5–5.2)
Sodium: 143 mmol/L (ref 134–144)

## 2018-09-23 LAB — LIPID PANEL
CHOLESTEROL TOTAL: 180 mg/dL (ref 100–199)
Chol/HDL Ratio: 5.8 ratio — ABNORMAL HIGH (ref 0.0–5.0)
HDL: 31 mg/dL — AB (ref >39)
LDL Calculated: 81 mg/dL (ref 0–99)
Triglycerides: 341 mg/dL — ABNORMAL HIGH (ref 0–149)
VLDL Cholesterol Cal: 68 mg/dL — ABNORMAL HIGH (ref 5–40)

## 2018-09-27 NOTE — Telephone Encounter (Signed)
NA, no vm jkp 10/7

## 2018-10-05 NOTE — Telephone Encounter (Signed)
Per Shanda Bumps Rostosky's notation on labs, patient aware of results on 10/04/2018.

## 2019-01-17 ENCOUNTER — Telehealth: Payer: Self-pay | Admitting: Family Medicine

## 2019-02-07 ENCOUNTER — Telehealth: Payer: Self-pay | Admitting: *Deleted

## 2019-02-20 NOTE — Telephone Encounter (Signed)
TC from [facility] [place] [facility] Pt had gone out to warm up his car, family member noticed it was taking him a long time, found him slumped over in the car and called 911. They performed CPR for 30 minutes, he stayed in asystole. I confirmed Dr. Louanne Skye will sign death certificate. Pt will be going to [facility]

## 2019-02-20 NOTE — Telephone Encounter (Signed)
Okay we will look for the paperwork

## 2019-02-20 DEATH — deceased

## 2019-03-23 ENCOUNTER — Ambulatory Visit: Payer: Medicare Other | Admitting: Cardiology
